# Patient Record
Sex: Female | Born: 1970 | Race: White | Hispanic: No | Marital: Married | Smoking: Never smoker
Health system: Southern US, Community
[De-identification: ages and names within clinical notes are randomized; demographics above are authoritative.]

## PROBLEM LIST (undated history)

## (undated) DIAGNOSIS — N200 Calculus of kidney: Secondary | ICD-10-CM

## (undated) HISTORY — DX: Calculus of kidney: N20.0

## (undated) HISTORY — PX: ABDOMINAL HYSTERECTOMY: SHX81

---

## 2010-11-24 ENCOUNTER — Ambulatory Visit (HOSPITAL_COMMUNITY)
Admission: RE | Admit: 2010-11-24 | Discharge: 2010-11-24 | Payer: Self-pay | Source: Home / Self Care | Attending: Urology | Admitting: Urology

## 2010-11-28 LAB — APTT: aPTT: 30 seconds (ref 24–37)

## 2010-11-28 LAB — CBC
HCT: 42.4 % (ref 36.0–46.0)
Hemoglobin: 14.1 g/dL (ref 12.0–15.0)
MCH: 29.3 pg (ref 26.0–34.0)
MCHC: 33.3 g/dL (ref 30.0–36.0)
MCV: 88 fL (ref 78.0–100.0)
Platelets: 228 10*3/uL (ref 150–400)
RBC: 4.82 MIL/uL (ref 3.87–5.11)
RDW: 12.7 % (ref 11.5–15.5)
WBC: 9.5 10*3/uL (ref 4.0–10.5)

## 2010-11-28 LAB — PROTIME-INR
INR: 1 (ref 0.00–1.49)
Prothrombin Time: 13.4 seconds (ref 11.6–15.2)

## 2012-08-04 IMAGING — CR DG ABDOMEN 1V
1 series · 1 of 1 positions shown · non-contrast
Comparison: None.

CLINICAL DATA: Right renal calculus.  Preop for lithotripsy.

ABDOMEN - 1 VIEW

[t abdomen supine]
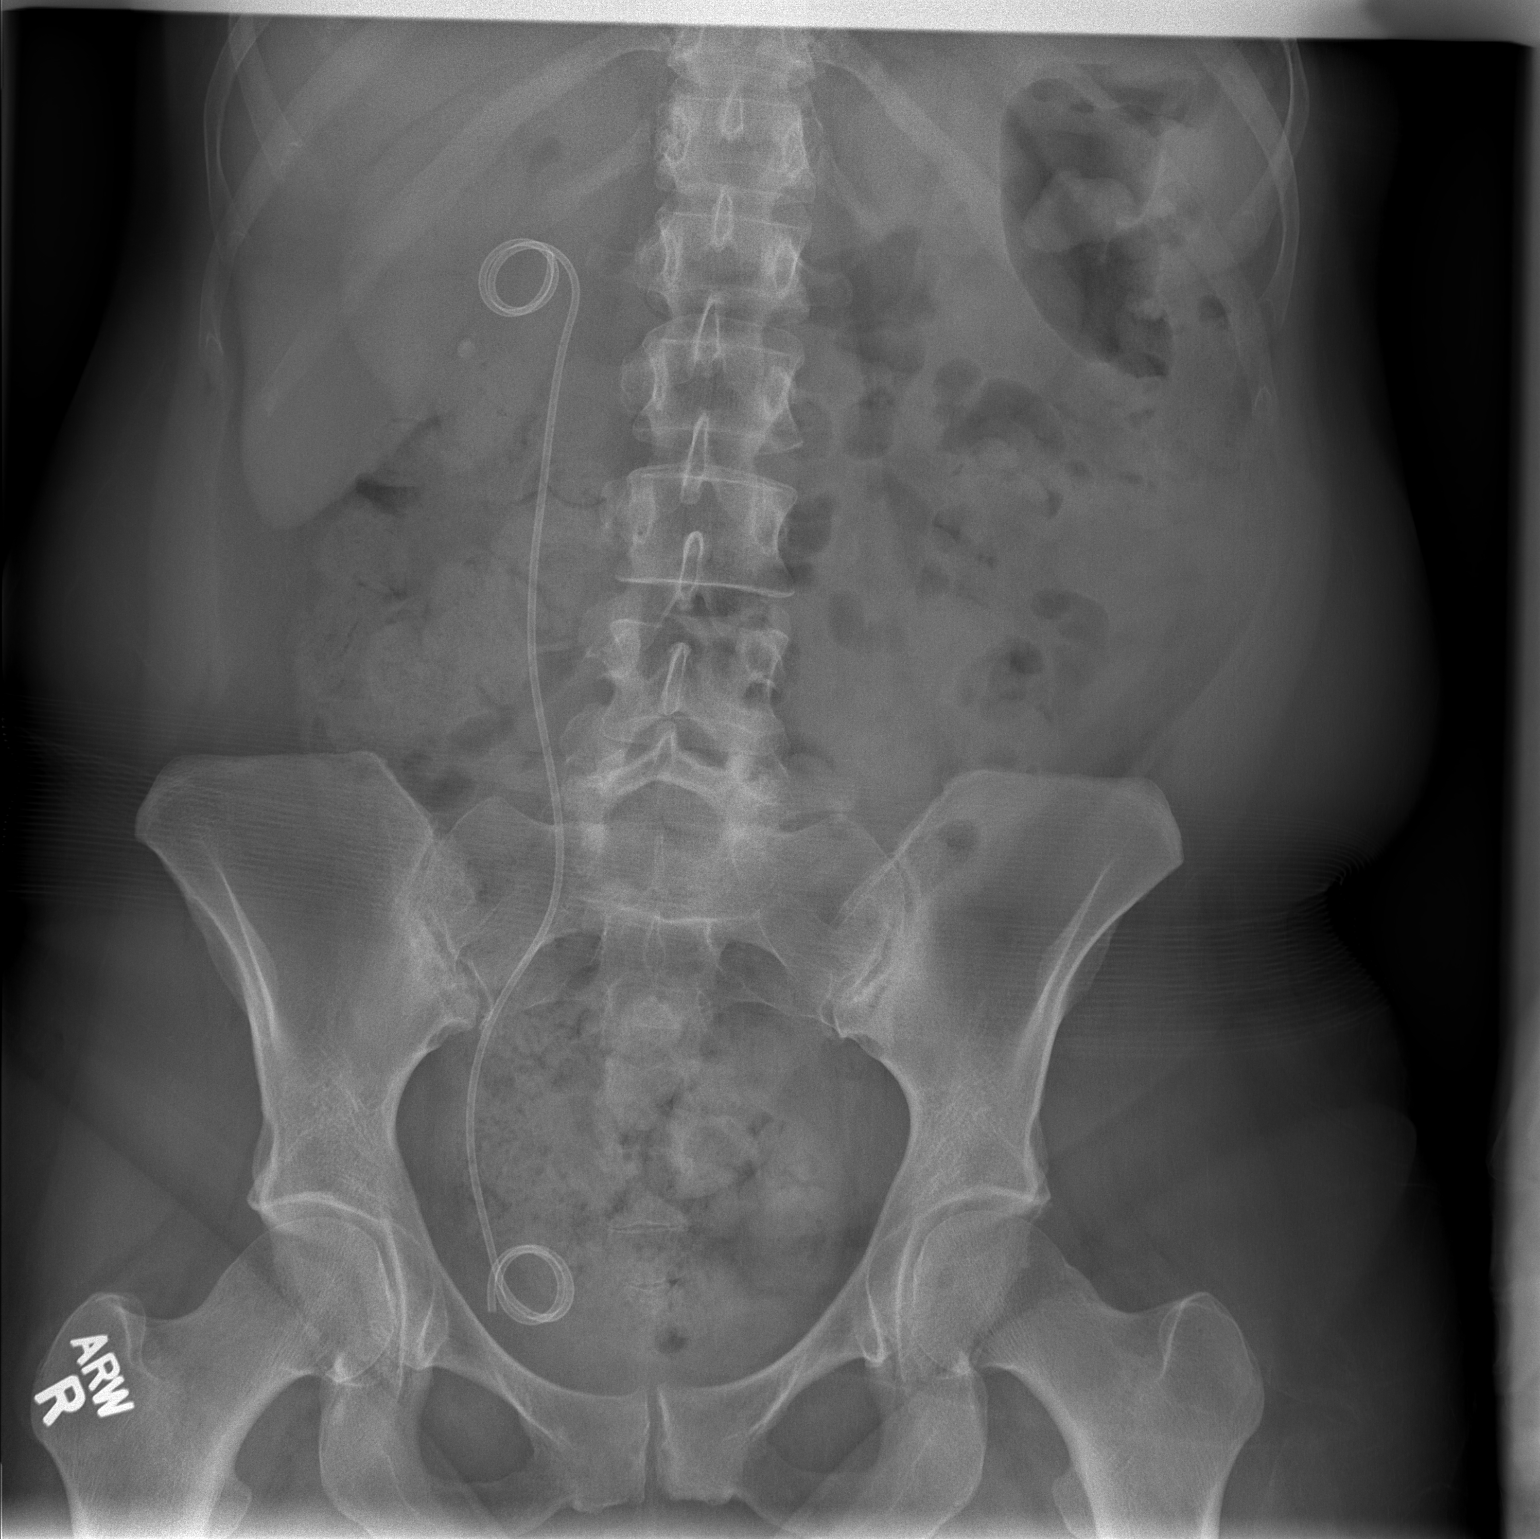

[1 of 1 positions shown; findings below may reference images not displayed]

FINDINGS: Right ureteral stent is seen in appropriate position.  A
calculus is seen in the lower pole of the right kidney which
measures 5 mm.  No other radiopaque urinary tract calculi are
identified.  The bowel gas pattern is normal.
IMPRESSION: 5 mm right lower pole renal calculus.  Right ureteral stent in
appropriate position.

## 2017-01-15 DIAGNOSIS — R202 Paresthesia of skin: Secondary | ICD-10-CM | POA: Diagnosis not present

## 2017-01-15 DIAGNOSIS — Z1239 Encounter for other screening for malignant neoplasm of breast: Secondary | ICD-10-CM | POA: Diagnosis not present

## 2017-01-15 DIAGNOSIS — Z0001 Encounter for general adult medical examination with abnormal findings: Secondary | ICD-10-CM | POA: Diagnosis not present

## 2017-01-15 DIAGNOSIS — Z0000 Encounter for general adult medical examination without abnormal findings: Secondary | ICD-10-CM | POA: Diagnosis not present

## 2017-01-15 DIAGNOSIS — R200 Anesthesia of skin: Secondary | ICD-10-CM | POA: Diagnosis not present

## 2017-01-18 DIAGNOSIS — Z1231 Encounter for screening mammogram for malignant neoplasm of breast: Secondary | ICD-10-CM | POA: Diagnosis not present

## 2017-11-22 DIAGNOSIS — G542 Cervical root disorders, not elsewhere classified: Secondary | ICD-10-CM | POA: Diagnosis not present

## 2017-11-22 DIAGNOSIS — M542 Cervicalgia: Secondary | ICD-10-CM | POA: Diagnosis not present

## 2017-11-26 DIAGNOSIS — J020 Streptococcal pharyngitis: Secondary | ICD-10-CM | POA: Diagnosis not present

## 2017-12-03 DIAGNOSIS — M50322 Other cervical disc degeneration at C5-C6 level: Secondary | ICD-10-CM | POA: Diagnosis not present

## 2017-12-03 DIAGNOSIS — M50323 Other cervical disc degeneration at C6-C7 level: Secondary | ICD-10-CM | POA: Diagnosis not present

## 2017-12-03 DIAGNOSIS — M4802 Spinal stenosis, cervical region: Secondary | ICD-10-CM | POA: Diagnosis not present

## 2017-12-06 DIAGNOSIS — M5412 Radiculopathy, cervical region: Secondary | ICD-10-CM | POA: Diagnosis not present

## 2017-12-06 DIAGNOSIS — M6281 Muscle weakness (generalized): Secondary | ICD-10-CM | POA: Diagnosis not present

## 2017-12-11 DIAGNOSIS — M5412 Radiculopathy, cervical region: Secondary | ICD-10-CM | POA: Diagnosis not present

## 2017-12-11 DIAGNOSIS — M6281 Muscle weakness (generalized): Secondary | ICD-10-CM | POA: Diagnosis not present

## 2017-12-12 DIAGNOSIS — N3000 Acute cystitis without hematuria: Secondary | ICD-10-CM | POA: Diagnosis not present

## 2017-12-13 DIAGNOSIS — M5412 Radiculopathy, cervical region: Secondary | ICD-10-CM | POA: Diagnosis not present

## 2017-12-13 DIAGNOSIS — M6281 Muscle weakness (generalized): Secondary | ICD-10-CM | POA: Diagnosis not present

## 2017-12-18 DIAGNOSIS — M5412 Radiculopathy, cervical region: Secondary | ICD-10-CM | POA: Diagnosis not present

## 2017-12-18 DIAGNOSIS — M6281 Muscle weakness (generalized): Secondary | ICD-10-CM | POA: Diagnosis not present

## 2017-12-20 DIAGNOSIS — J060 Acute laryngopharyngitis: Secondary | ICD-10-CM | POA: Diagnosis not present

## 2017-12-24 DIAGNOSIS — M542 Cervicalgia: Secondary | ICD-10-CM | POA: Diagnosis not present

## 2018-01-03 DIAGNOSIS — M5412 Radiculopathy, cervical region: Secondary | ICD-10-CM | POA: Diagnosis not present

## 2018-01-03 DIAGNOSIS — M6281 Muscle weakness (generalized): Secondary | ICD-10-CM | POA: Diagnosis not present

## 2018-01-07 DIAGNOSIS — M5412 Radiculopathy, cervical region: Secondary | ICD-10-CM | POA: Diagnosis not present

## 2018-01-07 DIAGNOSIS — M6281 Muscle weakness (generalized): Secondary | ICD-10-CM | POA: Diagnosis not present

## 2018-01-15 DIAGNOSIS — M6281 Muscle weakness (generalized): Secondary | ICD-10-CM | POA: Diagnosis not present

## 2018-01-15 DIAGNOSIS — M5412 Radiculopathy, cervical region: Secondary | ICD-10-CM | POA: Diagnosis not present

## 2018-01-22 DIAGNOSIS — M5412 Radiculopathy, cervical region: Secondary | ICD-10-CM | POA: Diagnosis not present

## 2018-01-22 DIAGNOSIS — M6281 Muscle weakness (generalized): Secondary | ICD-10-CM | POA: Diagnosis not present

## 2018-02-11 DIAGNOSIS — M6281 Muscle weakness (generalized): Secondary | ICD-10-CM | POA: Diagnosis not present

## 2018-02-11 DIAGNOSIS — M5412 Radiculopathy, cervical region: Secondary | ICD-10-CM | POA: Diagnosis not present

## 2020-09-09 ENCOUNTER — Telehealth (INDEPENDENT_AMBULATORY_CARE_PROVIDER_SITE_OTHER): Payer: Commercial Managed Care - PPO | Admitting: Family Medicine

## 2020-09-09 ENCOUNTER — Other Ambulatory Visit: Payer: Self-pay | Admitting: Family Medicine

## 2020-09-09 DIAGNOSIS — J00 Acute nasopharyngitis [common cold]: Secondary | ICD-10-CM | POA: Diagnosis not present

## 2020-09-09 MED ORDER — GUAIFENESIN-CODEINE 100-10 MG/5ML PO SOLN: 5.0000 mL | Freq: Three times a day (TID) | ORAL | 0 refills | Status: DC | PRN

## 2020-09-09 NOTE — Progress Notes (Signed)
Virtual Visit via Telephone Note   This visit type was conducted due to national recommendations for restrictions regarding the COVID-19 Pandemic (e.g. social distancing) in an effort to limit this patient's exposure and mitigate transmission in our community.  Due to her co-morbid illnesses, this patient is at least at moderate risk for complications without adequate follow up.  This format is felt to be most appropriate for this patient at this time.  The patient did not have access to video technology/had technical difficulties with video requiring transitioning to audio format only (telephone).  All issues noted in this document were discussed and addressed.  No physical exam could be performed with this format.  Patient verbally consented to a telehealth visit.   Date:  09/11/2020   ID:  Jane [facility], DOB Jul 18, 1971, MRN [id number]  Patient Location: Home Provider Location: Office/Clinic  PCP:  Blane Ohara, MD   Evaluation Performed: acute  Chief Complaint:  cough  History of Present Illness:    Jane Duncan is a 49 y.o. female c/o cough. Unable to sleep due to cough. The cough has been going on for a few days. No fever, chills, earaches, sore throat, and nasal congestion. Pt has had covid vaccinations. She has tried allergy medicines. She insists she does not have covid. This is a cough she gets every 1-2 yrs and the only thing that helps is codeine cough syrup.     The patient does not have symptoms concerning for COVID-19 infection (fever, chills, cough, or new shortness of breath).    Past Medical History:  Diagnosis Date  . Kidney stone     Past Surgical History:  Procedure Laterality Date  . ABDOMINAL HYSTERECTOMY     Unsure if abdominal or vaginal or if total or partial.   . CESAREAN SECTION     X #    Family History  Problem Relation Age of Onset  . CAD Other   . Hypertension Other     Social History   Socioeconomic History  . Marital status:  Married    Spouse name: Not on file  . Number of children: Not on file  . Years of education: Not on file  . Highest education level: Not on file  Occupational History  . Not on file  Tobacco Use  . Smoking status: Not on file  Substance and Sexual Activity  . Alcohol use: Not on file  . Drug use: Not on file  . Sexual activity: Not on file  Other Topics Concern  . Not on file  Social History Narrative  . Not on file   Social Determinants of Health   Financial Resource Strain:   . Difficulty of Paying Living Expenses: Not on file  Food Insecurity:   . Worried About Programme researcher, broadcasting/film/video in the Last Year: Not on file  . Ran Out of Food in the Last Year: Not on file  Transportation Needs:   . Lack of Transportation (Medical): Not on file  . Lack of Transportation (Non-Medical): Not on file  Physical Activity:   . Days of Exercise per Week: Not on file  . Minutes of Exercise per Session: Not on file  Stress:   . Feeling of Stress : Not on file  Social Connections:   . Frequency of Communication with Friends and Family: Not on file  . Frequency of Social Gatherings with Friends and Family: Not on file  . Attends Religious Services: Not on file  . Active Member of  Clubs or Organizations: Not on file  . Attends Banker Meetings: Not on file  . Marital Status: Not on file  Intimate Partner Violence:   . Fear of Current or Ex-Partner: Not on file  . Emotionally Abused: Not on file  . Physically Abused: Not on file  . Sexually Abused: Not on file    Outpatient Medications Prior to Visit  Medication Sig Dispense Refill  . erythromycin ophthalmic ointment SMARTSIG:In Eye(s)     No facility-administered medications prior to visit.    Allergies:   Ciprofloxacin and Erythromycin base   Social History   Tobacco Use  . Smoking status: Not on file  Substance Use Topics  . Alcohol use: Not on file  . Drug use: Not on file     Review of Systems    Constitutional: Negative for chills, fever and malaise/fatigue.  HENT: Negative for ear pain, sinus pain and sore throat.   Respiratory: Positive for cough. Negative for shortness of breath.   Cardiovascular: Negative for chest pain.  Musculoskeletal: Negative for myalgias.  Neurological: Negative for headaches.     Labs/Other Tests and Data Reviewed:    Recent Labs: No results found for requested labs within last 8760 hours.   Recent Lipid Panel No results found for: CHOL, TRIG, HDL, CHOLHDL, LDLCALC, LDLDIRECT  Wt Readings from Last 3 Encounters:  No data found for Wt     Objective:    Vital Signs:  There were no vitals taken for this visit.   Physical Exam  Does not sound short of breath on phone.   ASSESSMENT & PLAN:   1. Acute nasopharyngitis  Guaifenesin with codeine cough syrup. If worsens, pt should call and we will test her for covid 19.   COVID-19 Education: The signs and symptoms of COVID-19 were discussed with the patient and how to seek care for testing (follow up with PCP or arrange E-visit). The importance of social distancing was discussed today.  Time:   Today, I have spent 10 minutes with the patient with telehealth technology discussing the above problems.    Follow Up:  In Person prn  Signed, Blane Ohara, MD  09/11/2020 8:41 PM    [facility] [place]

## 2020-09-11 ENCOUNTER — Encounter: Payer: Self-pay | Admitting: Family Medicine

## 2021-07-25 ENCOUNTER — Other Ambulatory Visit: Payer: Self-pay

## 2021-07-25 DIAGNOSIS — Z0000 Encounter for general adult medical examination without abnormal findings: Secondary | ICD-10-CM

## 2021-08-04 ENCOUNTER — Other Ambulatory Visit (INDEPENDENT_AMBULATORY_CARE_PROVIDER_SITE_OTHER): Payer: Managed Care, Other (non HMO)

## 2021-08-04 DIAGNOSIS — Z23 Encounter for immunization: Secondary | ICD-10-CM | POA: Diagnosis not present

## 2021-08-04 DIAGNOSIS — Z0000 Encounter for general adult medical examination without abnormal findings: Secondary | ICD-10-CM

## 2021-08-04 NOTE — Progress Notes (Signed)
      Patient received her first Shingrix Vaccine today.  She tolerated it well.  Second dose is due in 2-6 months.  Creola Corn, LPN 19/62/22 9:79 AM

## 2021-08-05 LAB — CBC WITH DIFFERENTIAL/PLATELET
Basophils Absolute: 0 10*3/uL (ref 0.0–0.2)
Basos: 1 %
EOS (ABSOLUTE): 0.1 10*3/uL (ref 0.0–0.4)
Eos: 3 %
Hematocrit: 42.9 % (ref 34.0–46.6)
Hemoglobin: 14.1 g/dL (ref 11.1–15.9)
Immature Grans (Abs): 0 10*3/uL (ref 0.0–0.1)
Immature Granulocytes: 0 %
Lymphocytes Absolute: 2 10*3/uL (ref 0.7–3.1)
Lymphs: 38 %
MCH: 29.7 pg (ref 26.6–33.0)
MCHC: 32.9 g/dL (ref 31.5–35.7)
MCV: 90 fL (ref 79–97)
Monocytes Absolute: 0.6 10*3/uL (ref 0.1–0.9)
Monocytes: 11 %
Neutrophils Absolute: 2.4 10*3/uL (ref 1.4–7.0)
Neutrophils: 47 %
Platelets: 283 10*3/uL (ref 150–450)
RBC: 4.75 x10E6/uL (ref 3.77–5.28)
RDW: 12.2 % (ref 11.7–15.4)
WBC: 5.1 10*3/uL (ref 3.4–10.8)

## 2021-08-05 LAB — COMPREHENSIVE METABOLIC PANEL
ALT: 11 IU/L (ref 0–32)
AST: 15 IU/L (ref 0–40)
Albumin/Globulin Ratio: 2.1 (ref 1.2–2.2)
Albumin: 4.4 g/dL (ref 3.8–4.8)
Alkaline Phosphatase: 60 IU/L (ref 44–121)
BUN/Creatinine Ratio: 16 (ref 9–23)
BUN: 11 mg/dL (ref 6–24)
Bilirubin Total: 0.4 mg/dL (ref 0.0–1.2)
CO2: 20 mmol/L (ref 20–29)
Calcium: 9.3 mg/dL (ref 8.7–10.2)
Chloride: 105 mmol/L (ref 96–106)
Creatinine, Ser: 0.67 mg/dL (ref 0.57–1.00)
Globulin, Total: 2.1 g/dL (ref 1.5–4.5)
Glucose: 95 mg/dL (ref 65–99)
Potassium: 5 mmol/L (ref 3.5–5.2)
Sodium: 137 mmol/L (ref 134–144)
Total Protein: 6.5 g/dL (ref 6.0–8.5)
eGFR: 106 mL/min/{1.73_m2} (ref >59)

## 2021-08-05 LAB — LIPID PANEL
Chol/HDL Ratio: 2.1 ratio (ref 0.0–4.4)
Cholesterol, Total: 214 mg/dL — ABNORMAL HIGH (ref 100–199)
HDL: 104 mg/dL (ref >39)
LDL Chol Calc (NIH): 101 mg/dL — ABNORMAL HIGH (ref 0–99)
Triglycerides: 49 mg/dL (ref 0–149)
VLDL Cholesterol Cal: 9 mg/dL (ref 5–40)

## 2021-08-05 LAB — CARDIOVASCULAR RISK ASSESSMENT

## 2021-08-05 LAB — TSH: TSH: 1.47 u[IU]/mL (ref 0.450–4.500)

## 2021-08-11 ENCOUNTER — Encounter: Payer: Self-pay | Admitting: Family Medicine

## 2021-08-15 ENCOUNTER — Telehealth (INDEPENDENT_AMBULATORY_CARE_PROVIDER_SITE_OTHER): Payer: Managed Care, Other (non HMO) | Admitting: Physician Assistant

## 2021-08-15 ENCOUNTER — Encounter: Payer: Self-pay | Admitting: Physician Assistant

## 2021-08-15 VITALS — Ht 63.0 in | Wt 145.0 lb

## 2021-08-15 DIAGNOSIS — U071 COVID-19: Secondary | ICD-10-CM | POA: Diagnosis not present

## 2021-08-15 DIAGNOSIS — J020 Streptococcal pharyngitis: Secondary | ICD-10-CM | POA: Insufficient documentation

## 2021-08-15 HISTORY — DX: COVID-19: U07.1

## 2021-08-15 LAB — POC COVID19 BINAXNOW: SARS Coronavirus 2 Ag: POSITIVE — AB

## 2021-08-15 LAB — POCT RAPID STREP A (OFFICE): Rapid Strep A Screen: POSITIVE — AB

## 2021-08-15 MED ORDER — GUAIFENESIN-CODEINE 100-10 MG/5ML PO SYRP: 5.0000 mL | ORAL_SOLUTION | Freq: Three times a day (TID) | ORAL | 0 refills | Status: DC | PRN

## 2021-08-15 MED ORDER — AMOXICILLIN 875 MG PO TABS: 875.0000 mg | ORAL_TABLET | Freq: Two times a day (BID) | ORAL | 0 refills | Status: AC

## 2021-08-15 NOTE — Progress Notes (Signed)
Virtual Visit via Telephone Note   This visit type was conducted due to national recommendations for restrictions regarding the COVID-19 Pandemic (e.g. social distancing) in an effort to limit this patient's exposure and mitigate transmission in our community.  Due to her co-morbid illnesses, this patient is at least at moderate risk for complications without adequate follow up.  This format is felt to be most appropriate for this patient at this time.  The patient did not have access to video technology/had technical difficulties with video requiring transitioning to audio format only (telephone).  All issues noted in this document were discussed and addressed.  No physical exam could be performed with this format.  Patient verbally consented to a telehealth visit.   Date:  08/15/2021   ID:  Jane Duncan, DOB 1971/07/27, MRN [id number]  Patient Location: Home Provider Location: Office  PCP:  Blane Ohara, MD     Chief Complaint:  covid exposure/sore throat  History of Present Illness:    Jane Duncan is a 49 y.o. female with complaints of cough, sore throat and uri symptoms since last week - worse in past few days - husband with same symptoms The patient does have symptoms concerning for COVID-19 infection (fever, chills, cough, or new shortness of breath).    Past Medical History:  Diagnosis Date   Kidney stone    Past Surgical History:  Procedure Laterality Date   ABDOMINAL HYSTERECTOMY     Unsure if abdominal or vaginal or if total or partial.    CESAREAN SECTION     X #     Current Meds  Medication Sig   amoxicillin (AMOXIL) 875 MG tablet Take 1 tablet (875 mg total) by mouth 2 (two) times daily for 10 days.   guaiFENesin-codeine (ROBITUSSIN AC) 100-10 MG/5ML syrup Take 5 mLs by mouth 3 (three) times daily as needed for cough.     Allergies:   Ciprofloxacin and Erythromycin base   Social History   Tobacco Use   Smoking status: Never   Smokeless tobacco:  Never  Substance Use Topics   Alcohol use: Yes    Alcohol/week: 14.0 standard drinks    Types: 14 Glasses of wine per week    Comment: 1-2 glasses of wine per night.   Drug use: Never     Family Hx: The patient's family history includes CAD in her father and another family member; Hypertension in her brother and another family member.  ROS:   Please see the history of present illness.    All other systems reviewed and are negative.  Labs/Other Tests and Data Reviewed:    Recent Labs: 08/04/2021: ALT 11; BUN 11; Creatinine, Ser 0.67; Hemoglobin 14.1; Platelets 283; Potassium 5.0; Sodium 137; TSH 1.470   Recent Lipid Panel Lab Results  Component Value Date/Time   CHOL 214 (H) 08/04/2021 09:05 AM   TRIG 49 08/04/2021 09:05 AM   HDL 104 08/04/2021 09:05 AM   CHOLHDL 2.1 08/04/2021 09:05 AM   LDLCALC 101 (H) 08/04/2021 09:05 AM    Wt Readings from Last 3 Encounters:  08/15/21 145 lb (65.8 kg)     Objective:    Vital Signs:  Ht 5\' 3"  (1.6 m)   Wt 145 lb (65.8 kg)   BMI 25.69 kg/m    VITAL SIGNS:  reviewed GEN:  no acute distress Video Visit on 08/15/2021  Component Date Value Ref Range Status   SARS Coronavirus 2 Ag 08/15/2021 Positive (A) Negative Final   Rapid Strep A  Screen 08/15/2021 Positive (A) Negative Final     ASSESSMENT & PLAN:    Strep pharyngitis - rx for amoxil as directed COVID 19 - rx for cheratussin , rest, fluids - quarantine according to [employer] guidelines  COVID-19 Education: The signs and symptoms of COVID-19 were discussed with the patient and how to seek care for testing (follow up with PCP or arrange E-visit). The importance of social distancing was discussed today.  Time:   Today, I have spent 10 minutes with the patient with telehealth technology discussing the above problems.     Medication Adjustments/Labs and Tests Ordered: Current medicines are reviewed at length with the patient today.  Concerns regarding medicines are outlined above.    Tests Ordered: Orders Placed This Encounter  Procedures   POC COVID-19   POCT rapid strep A    Medication Changes: Meds ordered this encounter  Medications   amoxicillin (AMOXIL) 875 MG tablet    Sig: Take 1 tablet (875 mg total) by mouth 2 (two) times daily for 10 days.    Dispense:  20 tablet    Refill:  0    Order Specific Question:   Supervising Provider    Answer:   COX, Aniceto Boss   guaiFENesin-codeine (ROBITUSSIN AC) 100-10 MG/5ML syrup    Sig: Take 5 mLs by mouth 3 (three) times daily as needed for cough.    Dispense:  120 mL    Refill:  0    Order Specific Question:   Supervising Provider    AnswerCorey Harold    Follow Up:  In Person prn  Signed, Vickey Sages, PA-C  08/15/2021 2:42 PM    [facility]

## 2021-08-16 ENCOUNTER — Other Ambulatory Visit: Payer: Self-pay | Admitting: Family Medicine

## 2021-08-16 DIAGNOSIS — U071 COVID-19: Secondary | ICD-10-CM

## 2021-08-16 MED ORDER — GUAIFENESIN-CODEINE 100-10 MG/5ML PO SYRP: 5.0000 mL | ORAL_SOLUTION | Freq: Three times a day (TID) | ORAL | 0 refills | Status: DC | PRN

## 2021-08-26 ENCOUNTER — Ambulatory Visit (INDEPENDENT_AMBULATORY_CARE_PROVIDER_SITE_OTHER): Payer: Managed Care, Other (non HMO) | Admitting: Family Medicine

## 2021-08-26 ENCOUNTER — Other Ambulatory Visit: Payer: Self-pay

## 2021-08-26 ENCOUNTER — Encounter: Payer: Self-pay | Admitting: Family Medicine

## 2021-08-26 VITALS — BP 120/86 | HR 90 | Temp 96.2°F | Resp 15 | Ht 63.0 in | Wt 147.0 lb

## 2021-08-26 DIAGNOSIS — Z1231 Encounter for screening mammogram for malignant neoplasm of breast: Secondary | ICD-10-CM

## 2021-08-26 DIAGNOSIS — F5101 Primary insomnia: Secondary | ICD-10-CM

## 2021-08-26 DIAGNOSIS — Z23 Encounter for immunization: Secondary | ICD-10-CM

## 2021-08-26 DIAGNOSIS — Z0000 Encounter for general adult medical examination without abnormal findings: Secondary | ICD-10-CM

## 2021-08-26 MED ORDER — TRAZODONE HCL 50 MG PO TABS: 25.0000 mg | ORAL_TABLET | Freq: Every evening | ORAL | 0 refills | Status: DC | PRN

## 2021-08-26 NOTE — Patient Instructions (Signed)
Start on trazodone 50 mg once daily at night.

## 2021-08-26 NOTE — Progress Notes (Signed)
Subjective:  Patient ID: Jane [facility], female    DOB: 1970-11-25  Age: 50 y.o. MRN: [id number]  Chief Complaint  Patient presents with   Annual Exam    HPI   Well Adult Physical: Patient here for a comprehensive physical exam.The patient reports no problems Do you take any herbs or supplements that were not prescribed by a doctor? no Are you taking calcium supplements? no Are you taking aspirin daily? no  Encounter for general adult medical examination without abnormal findings  Physical ("At Risk" items are starred): Patient's last physical exam was 1 year ago .  Patient is not afflicted from Stress Incontinence and Urge Incontinence  Patient wears a seat belt, has smoke detectors, has carbon monoxide detectors, practices appropriate gun safety, and wears sunscreen with extended sun exposure. Dental Care: biannual cleanings, brushes and flosses daily. 06/2021 Ophthalmology/Optometry: Annual visit. Every February. Last one was on 12/2020. Hearing loss: none Vision impairments: none  Menarche: 50 years old Menstrual History: Hysterectomy LMP: Unknown Pregnancy history: 4 pregnancies Safe at home: Yes Self breast exams: Yes  Social Hx   Social History   Socioeconomic History   Marital status: Married    Spouse name: Not on file   Number of children: Not on file   Years of education: Not on file   Highest education level: Not on file  Occupational History   Occupation: home maker  Tobacco Use   Smoking status: Never   Smokeless tobacco: Never  Substance and Sexual Activity   Alcohol use: Yes    Alcohol/week: 14.0 standard drinks    Types: 14 Glasses of wine per week    Comment: 1-2 glasses of wine per night.   Drug use: Never   Sexual activity: Yes  Other Topics Concern   Not on file  Social History Narrative   Not on file   Social Determinants of Health   Financial Resource Strain: Not on file  Food Insecurity: Not on file  Transportation Needs: Not on  file  Physical Activity: Not on file  Stress: Not on file  Social Connections: Not on file   Past Medical History:  Diagnosis Date   COVID-19 08/15/2021   Kidney stone    Past Surgical History:  Procedure Laterality Date   ABDOMINAL HYSTERECTOMY     Unsure if abdominal or vaginal or if total or partial.    CESAREAN SECTION     X #    Family History  Problem Relation Age of Onset   CAD Other    Hypertension Other    CAD Father    Hypertension Brother     Review of Systems  Constitutional:  Negative for chills, fatigue and fever.  HENT:  Negative for congestion, ear pain and sore throat.   Respiratory:  Negative for cough and shortness of breath.   Cardiovascular:  Negative for chest pain and palpitations.  Gastrointestinal:  Negative for abdominal pain, constipation, diarrhea, nausea and vomiting.  Endocrine: Negative for polydipsia, polyphagia and polyuria.  Genitourinary:  Negative for difficulty urinating and dysuria.  Musculoskeletal:  Negative for arthralgias, back pain and myalgias.  Skin:  Negative for rash.  Neurological:  Negative for headaches.  Psychiatric/Behavioral:  Positive for sleep disturbance (has been waking up in the middle of the night. Initiation of sleep is fine. Has tried multiple behavioral techniques without success. this has only been an issue for the last few weeks.). Negative for dysphoric mood. The patient is not nervous/anxious.  Objective:  BP 120/86   Pulse 90   Temp (!) 96.2 F (35.7 C)   Resp 15   Ht 5\' 3"  (1.6 m)   Wt 147 lb (66.7 kg)   LMP  (LMP Unknown)   SpO2 98%   BMI 26.04 kg/m   BP/Weight 08/26/2021 08/15/2021  Systolic BP 120 -  Diastolic BP 86 -  Wt. (Lbs) 147 145  BMI 26.04 25.69    Physical Exam Vitals reviewed.  Constitutional:      General: She is not in acute distress.    Appearance: Normal appearance. She is normal weight.  HENT:     Right Ear: Tympanic membrane and ear canal normal.     Left Ear:  Tympanic membrane and ear canal normal.     Nose: Nose normal. No congestion or rhinorrhea.     Mouth/Throat:     Pharynx: No oropharyngeal exudate or posterior oropharyngeal erythema.  Eyes:     Conjunctiva/sclera: Conjunctivae normal.  Neck:     Thyroid: No thyroid mass.     Vascular: No carotid bruit.  Cardiovascular:     Rate and Rhythm: Normal rate and regular rhythm.     Pulses: Normal pulses.     Heart sounds: Normal heart sounds. No murmur heard. Pulmonary:     Effort: Pulmonary effort is normal.     Breath sounds: Normal breath sounds.  Abdominal:     General: Bowel sounds are normal.     Palpations: Abdomen is soft. There is no mass.     Tenderness: There is no abdominal tenderness.  Musculoskeletal:        General: Normal range of motion.  Lymphadenopathy:     Cervical: No cervical adenopathy.  Skin:    General: Skin is warm and dry.  Neurological:     Mental Status: She is alert and oriented to person, place, and time.     Cranial Nerves: No cranial nerve deficit.  Psychiatric:        Mood and Affect: Mood normal.        Behavior: Behavior normal.    Lab Results  Component Value Date   WBC 5.1 08/04/2021   HGB 14.1 08/04/2021   HCT 42.9 08/04/2021   PLT 283 08/04/2021   GLUCOSE 95 08/04/2021   CHOL 214 (H) 08/04/2021   TRIG 49 08/04/2021   HDL 104 08/04/2021   LDLCALC 101 (H) 08/04/2021   ALT 11 08/04/2021   AST 15 08/04/2021   NA 137 08/04/2021   K 5.0 08/04/2021   CL 105 08/04/2021   CREATININE 0.67 08/04/2021   BUN 11 08/04/2021   CO2 20 08/04/2021   TSH 1.470 08/04/2021   INR 1.00 11/24/2010      Assessment & Plan:   Problem List Items Addressed This Visit       Other   Primary insomnia    Start on trazodone 50 mg once at night prn insomnia. Recommend take it regularly for the next 1-2 weeks to reset her sleep cycle.      Relevant Medications   traZODone (DESYREL) 50 MG tablet   Visit for screening mammogram   Relevant Orders    MM Digital Screening   Routine medical exam - Primary    Recommend continue to work on eating healthy diet and exercise. Work on 01/23/2011.      Other Visit Diagnoses     Encounter for immunization       Relevant Orders   Flu Vaccine MDCK  QUAD PF (Completed)         Body mass index is 26.04 kg/m.    This is a list of the screening recommended for you and due dates:  Health Maintenance  Topic Date Due   Tetanus Vaccine  Never done   Colon Cancer Screening  Never done   Mammogram  Never done   COVID-19 Vaccine (3 - Booster for Pfizer series) 09/11/2021*   Hepatitis C Screening: USPSTF Recommendation to screen - Ages 18-79 yo.  09/04/2022*   HIV Screening  09/04/2022*   Zoster (Shingles) Vaccine (2 of 2) 09/29/2021   Flu Shot  Completed   Pneumococcal Vaccination  Aged Out   HPV Vaccine  Aged Out   Pap Smear  Discontinued  *Topic was postponed. The date shown is not the original due date.     Meds ordered this encounter  Medications   traZODone (DESYREL) 50 MG tablet    Sig: Take 0.5-1 tablets (25-50 mg total) by mouth at bedtime as needed for sleep.    Dispense:  30 tablet    Refill:  0     Follow-up: No follow-ups on file.  An After Visit Summary was printed and given to the patient.  Blane Ohara, MD [facility] [phone]

## 2021-08-28 DIAGNOSIS — F5101 Primary insomnia: Secondary | ICD-10-CM | POA: Insufficient documentation

## 2021-08-28 DIAGNOSIS — Z0000 Encounter for general adult medical examination without abnormal findings: Secondary | ICD-10-CM | POA: Insufficient documentation

## 2021-09-01 ENCOUNTER — Telehealth: Payer: Self-pay | Admitting: Family Medicine

## 2021-09-01 NOTE — Telephone Encounter (Signed)
   Jane Duncan has been scheduled for the following appointment:  WHAT: SCREENING MAMMOGRAM WHERE: [facility] DATE: 09/15/21 TIME: 4:15 PM ARRIVAL TIME  Patient has been made aware.

## 2021-09-04 DIAGNOSIS — Z1231 Encounter for screening mammogram for malignant neoplasm of breast: Secondary | ICD-10-CM | POA: Insufficient documentation

## 2021-09-04 NOTE — Assessment & Plan Note (Signed)
Start on trazodone 50 mg once at night prn insomnia. Recommend take it regularly for the next 1-2 weeks to reset her sleep cycle.

## 2021-09-04 NOTE — Assessment & Plan Note (Signed)
Recommend continue to work on eating healthy diet and exercise. Work on [employer].

## 2021-10-17 ENCOUNTER — Ambulatory Visit: Payer: Managed Care, Other (non HMO) | Admitting: Family Medicine

## 2021-10-17 ENCOUNTER — Encounter: Payer: Self-pay | Admitting: Family Medicine

## 2021-10-17 VITALS — BP 132/78 | HR 102 | Temp 97.7°F | Ht 63.0 in | Wt 147.0 lb

## 2021-10-17 DIAGNOSIS — J111 Influenza due to unidentified influenza virus with other respiratory manifestations: Secondary | ICD-10-CM | POA: Diagnosis not present

## 2021-10-17 DIAGNOSIS — R051 Acute cough: Secondary | ICD-10-CM | POA: Insufficient documentation

## 2021-10-17 LAB — POCT INFLUENZA A/B
Influenza A, POC: NEGATIVE
Influenza B, POC: NEGATIVE

## 2021-10-17 LAB — POC COVID19 BINAXNOW: SARS Coronavirus 2 Ag: NEGATIVE

## 2021-10-17 MED ORDER — HYDROCODONE BIT-HOMATROP MBR 5-1.5 MG/5ML PO SOLN: 5.0000 mL | Freq: Four times a day (QID) | ORAL | 0 refills | Status: DC | PRN

## 2021-10-17 MED ORDER — OSELTAMIVIR PHOSPHATE 75 MG PO CAPS: 75.0000 mg | ORAL_CAPSULE | Freq: Two times a day (BID) | ORAL | 0 refills | Status: DC

## 2021-10-17 NOTE — Progress Notes (Signed)
Acute Office Visit  Subjective:    Patient ID: Jane Duncan, female    DOB: 03-24-1971, 50 y.o.   MRN: [id number]  Chief Complaint  Patient presents with   Cough   Patient is in today for chills, chest tightness, cough, body aches, and headache began this morning. Cough Associated symptoms include chills, headaches, rhinorrhea and a sore throat. Pertinent negatives include no chest pain, ear pain, fever, myalgias, rash, shortness of breath or wheezing.    Past Medical History:  Diagnosis Date   COVID-19 08/15/2021   Kidney stone     Past Surgical History:  Procedure Laterality Date   ABDOMINAL HYSTERECTOMY     Unsure if abdominal or vaginal or if total or partial.    CESAREAN SECTION     X #    Family History  Problem Relation Age of Onset   CAD Other    Hypertension Other    CAD Father    Hypertension Brother     Social History   Socioeconomic History   Marital status: Married    Spouse name: Not on file   Number of children: Not on file   Years of education: Not on file   Highest education level: Not on file  Occupational History   Occupation: home maker  Tobacco Use   Smoking status: Never   Smokeless tobacco: Never  Substance and Sexual Activity   Alcohol use: Yes    Alcohol/week: 14.0 standard drinks    Types: 14 Glasses of wine per week    Comment: 1-2 glasses of wine per night.   Drug use: Never   Sexual activity: Yes  Other Topics Concern   Not on file  Social History Narrative   Not on file   Social Determinants of Health   Financial Resource Strain: Not on file  Food Insecurity: Not on file  Transportation Needs: Not on file  Physical Activity: Not on file  Stress: Not on file  Social Connections: Not on file  Intimate Partner Violence: Not on file    Outpatient Medications Prior to Visit  Medication Sig Dispense Refill   traZODone (DESYREL) 50 MG tablet Take 0.5-1 tablets (25-50 mg total) by mouth at bedtime as needed for  sleep. 30 tablet 0   No facility-administered medications prior to visit.    Allergies  Allergen Reactions   Ciprofloxacin     MALAISE   Erythromycin Base Rash    Review of Systems  Constitutional:  Positive for chills. Negative for appetite change, fatigue and fever.  HENT:  Positive for rhinorrhea and sore throat. Negative for congestion, ear pain and sinus pressure.   Eyes:  Negative for pain.  Respiratory:  Positive for cough and chest tightness. Negative for shortness of breath and wheezing.   Cardiovascular:  Negative for chest pain and palpitations.  Gastrointestinal:  Negative for abdominal pain, constipation, diarrhea, nausea and vomiting.  Genitourinary:  Negative for dysuria and hematuria.  Musculoskeletal:  Negative for arthralgias, back pain, joint swelling and myalgias.  Skin:  Negative for rash.  Neurological:  Positive for headaches. Negative for dizziness and weakness.  Psychiatric/Behavioral:  Negative for dysphoric mood. The patient is not nervous/anxious.       Objective:    Physical Exam Vitals reviewed.  Constitutional:      Appearance: She is normal weight. She is ill-appearing.  HENT:     Right Ear: Tympanic membrane, ear canal and external ear normal.     Left Ear: Tympanic membrane, ear  canal and external ear normal.     Nose: Congestion present.     Mouth/Throat:     Pharynx: Oropharynx is clear.  Cardiovascular:     Rate and Rhythm: Normal rate and regular rhythm.     Heart sounds: Normal heart sounds. No murmur heard. Pulmonary:     Effort: Pulmonary effort is normal. No respiratory distress.     Breath sounds: Normal breath sounds.  Lymphadenopathy:     Cervical: No cervical adenopathy.  Neurological:     Mental Status: She is alert and oriented to person, place, and time.  Psychiatric:        Mood and Affect: Mood normal.        Behavior: Behavior normal.    BP 132/78 (BP Location: Right Arm, Patient Position: Sitting)   Pulse (!)  102   Temp 97.7 F (36.5 C) (Temporal)   Ht _0  (1.6 m)   Wt 147 lb (66.7 kg)   LMP  (LMP Unknown)   SpO2 97%   BMI 26.04 kg/m  Wt Readings from Last 3 Encounters:  10/17/21 147 lb (66.7 kg)  08/26/21 147 lb (66.7 kg)  08/15/21 145 lb (65.8 kg)    Health Maintenance Due  Topic Date Due   TETANUS/TDAP  Never done   COLONOSCOPY (Pts 45-40yr Insurance coverage will need to be confirmed)  Never done   COVID-19 Vaccine (3 - Booster for [employer]) 08/06/2020   MAMMOGRAM  Never done   Zoster Vaccines- Shingrix (2 of 2) 09/29/2021    There are no preventive care reminders to display for this patient.   Lab Results  Component Value Date   TSH 1.470 08/04/2021   Lab Results  Component Value Date   WBC 5.1 08/04/2021   HGB 14.1 08/04/2021   HCT 42.9 08/04/2021   MCV 90 08/04/2021   PLT 283 08/04/2021   Lab Results  Component Value Date   NA 137 08/04/2021   K 5.0 08/04/2021   CO2 20 08/04/2021   GLUCOSE 95 08/04/2021   BUN 11 08/04/2021   CREATININE 0.67 08/04/2021   BILITOT 0.4 08/04/2021   ALKPHOS 60 08/04/2021   AST 15 08/04/2021   ALT 11 08/04/2021   PROT 6.5 08/04/2021   ALBUMIN 4.4 08/04/2021   CALCIUM 9.3 08/04/2021   EGFR 106 08/04/2021   Lab Results  Component Value Date   CHOL 214 (H) 08/04/2021   Lab Results  Component Value Date   HDL 104 08/04/2021   Lab Results  Component Value Date   LDLCALC 101 (H) 08/04/2021   Lab Results  Component Value Date   TRIG 49 08/04/2021   Lab Results  Component Value Date   CHOLHDL 2.1 08/04/2021   No results found for: HGBA1C       Assessment & Plan:   Problem List Items Addressed This Visit       Respiratory   Influenza    Despite negative flu test, I am concerned it is a false negative as clinically she has influenza. Rx. Tamiflu given.  Rest, Fluids, Tylenol, nsaids.        Relevant Medications   oseltamivir (TAMIFLU) 75 MG capsule   Other Relevant Orders   POCT Influenza A/B  (Completed)     Other   Acute cough - Primary    hydromet cough syrup Negative for covid      Relevant Orders   POC COVID-19 BinaxNow (Completed)    I,Lauren M Auman,acting as a scribe for  Rochel Brome, MD.,have documented all relevant documentation on the behalf of Rochel Brome, MD,as directed by  Rochel Brome, MD while in the presence of Rochel Brome, MD.   Rochel Brome, MD

## 2021-10-17 NOTE — Assessment & Plan Note (Signed)
hydromet cough syrup Negative for covid

## 2021-10-17 NOTE — Assessment & Plan Note (Signed)
Despite negative flu test, I am concerned it is a false negative as clinically she has influenza. Rx. Tamiflu given.  Rest, Fluids, Tylenol, nsaids.

## 2021-11-10 NOTE — Telephone Encounter (Signed)
PT NO SHOWED FOR MAMMOGRAM

## 2021-12-29 ENCOUNTER — Other Ambulatory Visit: Payer: Self-pay

## 2021-12-29 ENCOUNTER — Ambulatory Visit: Payer: Managed Care, Other (non HMO) | Admitting: Family Medicine

## 2021-12-29 VITALS — Ht 63.0 in | Wt 146.0 lb

## 2021-12-29 DIAGNOSIS — R300 Dysuria: Secondary | ICD-10-CM

## 2021-12-29 DIAGNOSIS — N3000 Acute cystitis without hematuria: Secondary | ICD-10-CM | POA: Diagnosis not present

## 2021-12-29 LAB — POCT URINALYSIS DIPSTICK
Bilirubin, UA: NEGATIVE
Blood, UA: NEGATIVE
Glucose, UA: NEGATIVE
Ketones, UA: NEGATIVE
Nitrite, UA: NEGATIVE
Protein, UA: NEGATIVE
Spec Grav, UA: <=1.005 — AB (ref 1.010–1.025)
Urobilinogen, UA: 0.2 E.U./dL
pH, UA: 7 (ref 5.0–8.0)

## 2021-12-29 MED ORDER — NITROFURANTOIN MONOHYD MACRO 100 MG PO CAPS: 100.0000 mg | ORAL_CAPSULE | Freq: Two times a day (BID) | ORAL | 0 refills | Status: DC

## 2021-12-29 NOTE — Progress Notes (Signed)
Acute Office Visit  Subjective:    Patient ID: Jane Duncan, female    DOB: 10-07-1971, 51 y.o.   MRN: [id number]  Chief Complaint  Patient presents with   Dysuria   HPI: Patient is in today for dysuria.  Symptoms started early this am with urinary frequency and burning.   Past Medical History:  Diagnosis Date   COVID-19 08/15/2021   Kidney stone     Past Surgical History:  Procedure Laterality Date   ABDOMINAL HYSTERECTOMY     Unsure if abdominal or vaginal or if total or partial.    CESAREAN SECTION     X #    Family History  Problem Relation Age of Onset   CAD Other    Hypertension Other    CAD Father    Hypertension Brother     Social History   Socioeconomic History   Marital status: Married    Spouse name: Not on file   Number of children: Not on file   Years of education: Not on file   Highest education level: Not on file  Occupational History   Occupation: home maker  Tobacco Use   Smoking status: Never   Smokeless tobacco: Never  Substance and Sexual Activity   Alcohol use: Yes    Alcohol/week: 14.0 standard drinks    Types: 14 Glasses of wine per week    Comment: 1-2 glasses of wine per night.   Drug use: Never   Sexual activity: Yes  Other Topics Concern   Not on file  Social History Narrative   Not on file   Social Determinants of Health   Financial Resource Strain: Not on file  Food Insecurity: Not on file  Transportation Needs: Not on file  Physical Activity: Not on file  Stress: Not on file  Social Connections: Not on file  Intimate Partner Violence: Not on file    Outpatient Medications Prior to Visit  Medication Sig Dispense Refill   traZODone (DESYREL) 50 MG tablet Take 0.5-1 tablets (25-50 mg total) by mouth at bedtime as needed for sleep. 30 tablet 0   HYDROcodone bit-homatropine (HYDROMET) 5-1.5 MG/5ML syrup Take 5 mLs by mouth every 6 (six) hours as needed for cough. 120 mL 0   oseltamivir (TAMIFLU) 75 MG capsule  Take 1 capsule (75 mg total) by mouth 2 (two) times daily. 10 capsule 0   No facility-administered medications prior to visit.    Allergies  Allergen Reactions   Ciprofloxacin     MALAISE   Erythromycin Base Rash    Review of Systems  Constitutional:  Negative for chills, fatigue and fever.  HENT:  Negative for congestion, rhinorrhea and sore throat.   Respiratory:  Negative for cough and shortness of breath.   Cardiovascular:  Negative for chest pain.  Gastrointestinal:  Negative for abdominal pain, constipation, diarrhea, nausea and vomiting.  Genitourinary:  Positive for dysuria, frequency and urgency.  Musculoskeletal:  Negative for back pain and myalgias.  Neurological:  Negative for dizziness, weakness, light-headedness and headaches.  Psychiatric/Behavioral:  Negative for dysphoric mood. The patient is not nervous/anxious.       Objective:    Physical Exam Vitals reviewed.  Constitutional:      Appearance: Normal appearance. She is normal weight.  Cardiovascular:     Rate and Rhythm: Normal rate and regular rhythm.     Heart sounds: Normal heart sounds.  Pulmonary:     Effort: Pulmonary effort is normal. No respiratory distress.  Breath sounds: Normal breath sounds.  Abdominal:     General: Abdomen is flat. Bowel sounds are normal.     Palpations: Abdomen is soft.     Tenderness: There is no abdominal tenderness.  Neurological:     Mental Status: She is alert and oriented to person, place, and time.  Psychiatric:        Mood and Affect: Mood normal.        Behavior: Behavior normal.    Ht 5' 3"  (1.6 m)    Wt 146 lb (66.2 kg)    LMP  (LMP Unknown)    BMI 25.86 kg/m  Wt Readings from Last 3 Encounters:  12/29/21 146 lb (66.2 kg)  10/17/21 147 lb (66.7 kg)  08/26/21 147 lb (66.7 kg)    Health Maintenance Due  Topic Date Due   TETANUS/TDAP  Never done   COLONOSCOPY (Pts 45-64yr Insurance coverage will need to be confirmed)  Never done   COVID-19  Vaccine (3 - Booster for [employer]) 08/06/2020   MAMMOGRAM  Never done   Zoster Vaccines- Shingrix (2 of 2) 09/29/2021    There are no preventive care reminders to display for this patient.   Lab Results  Component Value Date   TSH 1.470 08/04/2021   Lab Results  Component Value Date   WBC 5.1 08/04/2021   HGB 14.1 08/04/2021   HCT 42.9 08/04/2021   MCV 90 08/04/2021   PLT 283 08/04/2021   Lab Results  Component Value Date   NA 137 08/04/2021   K 5.0 08/04/2021   CO2 20 08/04/2021   GLUCOSE 95 08/04/2021   BUN 11 08/04/2021   CREATININE 0.67 08/04/2021   BILITOT 0.4 08/04/2021   ALKPHOS 60 08/04/2021   AST 15 08/04/2021   ALT 11 08/04/2021   PROT 6.5 08/04/2021   ALBUMIN 4.4 08/04/2021   CALCIUM 9.3 08/04/2021   EGFR 106 08/04/2021   Lab Results  Component Value Date   CHOL 214 (H) 08/04/2021   Lab Results  Component Value Date   HDL 104 08/04/2021   Lab Results  Component Value Date   LDLCALC 101 (H) 08/04/2021   Lab Results  Component Value Date   TRIG 49 08/04/2021   Lab Results  Component Value Date   CHOLHDL 2.1 08/04/2021   No results found for: HGBA1C     Assessment & Plan:   Problem List Items Addressed This Visit       Genitourinary   Acute cystitis without hematuria - Primary    Rx: macrobid and pyridium      Relevant Medications   nitrofurantoin, macrocrystal-monohydrate, (MACROBID) 100 MG capsule   Other Relevant Orders   POCT urinalysis dipstick (Completed)   Urine Culture   Meds ordered this encounter  Medications   nitrofurantoin, macrocrystal-monohydrate, (MACROBID) 100 MG capsule    Sig: Take 1 capsule (100 mg total) by mouth 2 (two) times daily.    Dispense:  14 capsule    Refill:  0    Orders Placed This Encounter  Procedures   Urine Culture   POCT urinalysis dipstick     Follow-up: Return if symptoms worsen or fail to improve.  An After Visit Summary was printed and given to the patient.  KRochel Brome MD [facility] ([phone]

## 2021-12-31 ENCOUNTER — Encounter: Payer: Self-pay | Admitting: Family Medicine

## 2021-12-31 DIAGNOSIS — N3000 Acute cystitis without hematuria: Secondary | ICD-10-CM | POA: Insufficient documentation

## 2021-12-31 NOTE — Assessment & Plan Note (Signed)
Rx: macrobid and pyridium

## 2022-02-10 ENCOUNTER — Encounter: Payer: Self-pay | Admitting: Family Medicine

## 2022-02-10 ENCOUNTER — Ambulatory Visit: Payer: Managed Care, Other (non HMO) | Admitting: Family Medicine

## 2022-02-10 VITALS — BP 110/68 | HR 78 | Temp 96.4°F | Resp 18 | Ht 63.0 in | Wt 146.2 lb

## 2022-02-10 DIAGNOSIS — K117 Disturbances of salivary secretion: Secondary | ICD-10-CM | POA: Diagnosis not present

## 2022-02-10 MED ORDER — MONTELUKAST SODIUM 10 MG PO TABS: 10.0000 mg | ORAL_TABLET | Freq: Every day | ORAL | 3 refills | Status: DC

## 2022-02-10 NOTE — Progress Notes (Signed)
? ?Acute Office Visit ? ?Subjective:  ? ? Patient ID: Jane Duncan, female    DOB: 24-Jun-1971, 51 y.o.   MRN: [id number] ? ?Chief Complaint  ?Patient presents with  ? dry mouth  ? ? ?HPI: ?Patient is in today for dry lips/mouth, dry tongue and back of lips. Does not ever fully go away. Feels like gums are swollen. Saw dentist and did not see anything of concern.  ?ON mucinex for cough but started prior to start ? ?Past Medical History:  ?Diagnosis Date  ? COVID-19 08/15/2021  ? Kidney stone   ? ? ?Past Surgical History:  ?Procedure Laterality Date  ? ABDOMINAL HYSTERECTOMY    ? Unsure if abdominal or vaginal or if total or partial.   ? CESAREAN SECTION    ? X #  ? ? ?Family History  ?Problem Relation Age of Onset  ? CAD Other   ? Hypertension Other   ? CAD Father   ? Hypertension Brother   ? ? ?Social History  ? ?Socioeconomic History  ? Marital status: Married  ?  Spouse name: Not on file  ? Number of children: Not on file  ? Years of education: Not on file  ? Highest education level: Not on file  ?Occupational History  ? Occupation: home maker  ?Tobacco Use  ? Smoking status: Never  ? Smokeless tobacco: Never  ?Substance and Sexual Activity  ? Alcohol use: Yes  ?  Alcohol/week: 14.0 standard drinks  ?  Types: 14 Glasses of wine per week  ?  Comment: 1-2 glasses of wine per night.  ? Drug use: Never  ? Sexual activity: Yes  ?Other Topics Concern  ? Not on file  ?Social History Narrative  ? Not on file  ? ?Social Determinants of Health  ? ?Financial Resource Strain: Not on file  ?Food Insecurity: Not on file  ?Transportation Needs: Not on file  ?Physical Activity: Not on file  ?Stress: Not on file  ?Social Connections: Not on file  ?Intimate Partner Violence: Not on file  ? ? ?Outpatient Medications Prior to Visit  ?Medication Sig Dispense Refill  ? nystatin (MYCOSTATIN) 100000 UNIT/ML suspension Take 1 mL by mouth 4 (four) times daily.    ? nitrofurantoin, macrocrystal-monohydrate, (MACROBID) 100 MG capsule Take  1 capsule (100 mg total) by mouth 2 (two) times daily. 14 capsule 0  ? traZODone (DESYREL) 50 MG tablet Take 0.5-1 tablets (25-50 mg total) by mouth at bedtime as needed for sleep. 30 tablet 0  ? ?No facility-administered medications prior to visit.  ? ? ?Allergies  ?Allergen Reactions  ? Ciprofloxacin   ?  MALAISE  ? Erythromycin Base Rash  ? ? ?Review of Systems  ?Constitutional:  Negative for chills and fever.  ?HENT:    ?     Dry mouth   ?Respiratory:  Positive for cough.   ? ?   ?Objective:  ?  ?Physical Exam ?Vitals reviewed.  ?Constitutional:   ?   Appearance: Normal appearance.  ?HENT:  ?   Right Ear: Tympanic membrane, ear canal and external ear normal.  ?   Left Ear: Tympanic membrane, ear canal and external ear normal.  ?   Nose: Nose normal.  ?   Mouth/Throat:  ?   Pharynx: Oropharynx is clear.  ?   Comments: Lips dry ?Cardiovascular:  ?   Rate and Rhythm: Normal rate and regular rhythm.  ?   Heart sounds: Normal heart sounds. No murmur heard. ?Pulmonary:  ?  Effort: Pulmonary effort is normal. No respiratory distress.  ?   Breath sounds: Normal breath sounds.  ?Lymphadenopathy:  ?   Cervical: No cervical adenopathy.  ?Neurological:  ?   Mental Status: She is alert and oriented to person, place, and time.  ?Psychiatric:     ?   Mood and Affect: Mood normal.     ?   Behavior: Behavior normal.  ? ? ?BP 110/68   Pulse 78   Temp (!) 96.4 ?F (35.8 ?C)   Resp 18   Ht 5' 3"  (1.6 m)   Wt 146 lb 3.2 oz (66.3 kg)   LMP  (LMP Unknown)   BMI 25.90 kg/m?  ?Wt Readings from Last 3 Encounters:  ?02/10/22 146 lb 3.2 oz (66.3 kg)  ?12/29/21 146 lb (66.2 kg)  ?10/17/21 147 lb (66.7 kg)  ? ? ?Health Maintenance Due  ?Topic Date Due  ? TETANUS/TDAP  Never done  ? COLONOSCOPY (Pts 45-20yr Insurance coverage will need to be confirmed)  Never done  ? COVID-19 Vaccine (3 - Booster for [employer] series) 08/06/2020  ? MAMMOGRAM  Never done  ? Zoster Vaccines- Shingrix (2 of 2) 09/29/2021  ? ? ?There are no preventive care  reminders to display for this patient. ? ? ?Lab Results  ?Component Value Date  ? TSH 1.950 02/10/2022  ? ?Lab Results  ?Component Value Date  ? WBC 6.9 02/10/2022  ? HGB 14.0 02/10/2022  ? HCT 42.3 02/10/2022  ? MCV 89 02/10/2022  ? PLT 303 02/10/2022  ? ?Lab Results  ?Component Value Date  ? NA 139 02/10/2022  ? K 4.6 02/10/2022  ? CO2 21 02/10/2022  ? GLUCOSE 90 02/10/2022  ? BUN 10 02/10/2022  ? CREATININE 0.75 02/10/2022  ? BILITOT 0.3 02/10/2022  ? ALKPHOS 69 02/10/2022  ? AST 14 02/10/2022  ? ALT 13 02/10/2022  ? PROT 6.8 02/10/2022  ? ALBUMIN 4.6 02/10/2022  ? CALCIUM 9.2 02/10/2022  ? EGFR 96 02/10/2022  ? ?Lab Results  ?Component Value Date  ? CHOL 214 (H) 08/04/2021  ? ?Lab Results  ?Component Value Date  ? HDL 104 08/04/2021  ? ?Lab Results  ?Component Value Date  ? LDLCALC 101 (H) 08/04/2021  ? ?Lab Results  ?Component Value Date  ? TRIG 49 08/04/2021  ? ?Lab Results  ?Component Value Date  ? CHOLHDL 2.1 08/04/2021  ? ?No results found for: HGBA1C ? ?   ?Assessment & Plan:  ? ?Problem List Items Addressed This Visit   ? ?  ? Digestive  ? Xerostomia - Primary  ?  Check labs.  ?Recommend biotin. ?  ?  ? Relevant Orders  ? CBC with Differential/Platelet (Completed)  ? TSH (Completed)  ? Sjogren's syndrome antibods(ssa + ssb) (Completed)  ? Comprehensive metabolic panel (Completed)  ? ?Meds ordered this encounter  ?Medications  ? montelukast (SINGULAIR) 10 MG tablet  ?  Sig: Take 1 tablet (10 mg total) by mouth at bedtime.  ?  Dispense:  30 tablet  ?  Refill:  3  ? ? ?Orders Placed This Encounter  ?Procedures  ? CBC with Differential/Platelet  ? TSH  ? Sjogren's syndrome antibods(ssa + ssb)  ? Comprehensive metabolic panel  ?  ? ?Follow-up: No follow-ups on file. ? ?An After Visit Summary was printed and given to the patient. ? ?KRochel Brome MD ?[facility]?([phone]?

## 2022-02-10 NOTE — Patient Instructions (Signed)
Biotin otc.  ?Recommend singulair 10 mg once daily. ?

## 2022-02-11 LAB — SJOGREN'S SYNDROME ANTIBODS(SSA + SSB)
ENA SSA (RO) Ab: <0.2 AI (ref 0.0–0.9)
ENA SSB (LA) Ab: <0.2 AI (ref 0.0–0.9)

## 2022-02-11 LAB — CBC WITH DIFFERENTIAL/PLATELET
Basophils Absolute: 0.1 10*3/uL (ref 0.0–0.2)
Basos: 1 %
EOS (ABSOLUTE): 0.2 10*3/uL (ref 0.0–0.4)
Eos: 4 %
Hematocrit: 42.3 % (ref 34.0–46.6)
Hemoglobin: 14 g/dL (ref 11.1–15.9)
Immature Grans (Abs): 0 10*3/uL (ref 0.0–0.1)
Immature Granulocytes: 0 %
Lymphocytes Absolute: 2.8 10*3/uL (ref 0.7–3.1)
Lymphs: 40 %
MCH: 29.4 pg (ref 26.6–33.0)
MCHC: 33.1 g/dL (ref 31.5–35.7)
MCV: 89 fL (ref 79–97)
Monocytes Absolute: 0.8 10*3/uL (ref 0.1–0.9)
Monocytes: 11 %
Neutrophils Absolute: 3.1 10*3/uL (ref 1.4–7.0)
Neutrophils: 44 %
Platelets: 303 10*3/uL (ref 150–450)
RBC: 4.76 x10E6/uL (ref 3.77–5.28)
RDW: 12.8 % (ref 11.7–15.4)
WBC: 6.9 10*3/uL (ref 3.4–10.8)

## 2022-02-11 LAB — COMPREHENSIVE METABOLIC PANEL
ALT: 13 IU/L (ref 0–32)
AST: 14 IU/L (ref 0–40)
Albumin/Globulin Ratio: 2.1 (ref 1.2–2.2)
Albumin: 4.6 g/dL (ref 3.8–4.9)
Alkaline Phosphatase: 69 IU/L (ref 44–121)
BUN/Creatinine Ratio: 13 (ref 9–23)
BUN: 10 mg/dL (ref 6–24)
Bilirubin Total: 0.3 mg/dL (ref 0.0–1.2)
CO2: 21 mmol/L (ref 20–29)
Calcium: 9.2 mg/dL (ref 8.7–10.2)
Chloride: 104 mmol/L (ref 96–106)
Creatinine, Ser: 0.75 mg/dL (ref 0.57–1.00)
Globulin, Total: 2.2 g/dL (ref 1.5–4.5)
Glucose: 90 mg/dL (ref 70–99)
Potassium: 4.6 mmol/L (ref 3.5–5.2)
Sodium: 139 mmol/L (ref 134–144)
Total Protein: 6.8 g/dL (ref 6.0–8.5)
eGFR: 96 mL/min/{1.73_m2} (ref >59)

## 2022-02-11 LAB — TSH: TSH: 1.95 u[IU]/mL (ref 0.450–4.500)

## 2022-02-12 DIAGNOSIS — K117 Disturbances of salivary secretion: Secondary | ICD-10-CM | POA: Insufficient documentation

## 2022-02-12 NOTE — Assessment & Plan Note (Signed)
Check labs.  ?Recommend biotin. ?

## 2022-08-14 ENCOUNTER — Encounter: Payer: Self-pay | Admitting: Family Medicine

## 2022-08-14 ENCOUNTER — Ambulatory Visit: Payer: BC Managed Care – PPO | Admitting: Family Medicine

## 2022-08-14 VITALS — BP 128/80 | HR 65 | Temp 97.4°F | Ht 63.0 in | Wt 148.0 lb

## 2022-08-14 DIAGNOSIS — J029 Acute pharyngitis, unspecified: Secondary | ICD-10-CM

## 2022-08-14 DIAGNOSIS — J069 Acute upper respiratory infection, unspecified: Secondary | ICD-10-CM | POA: Diagnosis not present

## 2022-08-14 LAB — POCT RAPID STREP A (OFFICE): Rapid Strep A Screen: NEGATIVE

## 2022-08-14 LAB — POC COVID19 BINAXNOW: SARS Coronavirus 2 Ag: NEGATIVE

## 2022-08-14 NOTE — Progress Notes (Unsigned)
Acute Office Visit  Subjective:    Patient ID: Jane Duncan, female    DOB: [birth year], 51 y.o.   MRN: [id number]  Chief Complaint  Patient presents with   Sore Throat   EAR PAIN    Sore Throat  Associated symptoms include ear pain. Pertinent negatives include no abdominal pain, congestion, coughing, diarrhea, headaches, shortness of breath or vomiting.   Patient is in today for earache and sore throat.  Symptoms been going on for few days.  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  Denied symptoms as stated above.  COVID-19 test done today in our parking lot was negative.  Strep test came back negative also.  Past Medical History:  Diagnosis Date   COVID-19 08/15/2021   Kidney stone     Past Surgical History:  Procedure Laterality Date   ABDOMINAL HYSTERECTOMY     Unsure if abdominal or vaginal or if total or partial.    CESAREAN SECTION     X #    Family History  Problem Relation Age of Onset   CAD Other    Hypertension Other    CAD Father    Hypertension Brother     Social History   Socioeconomic History   Marital status: Married    Spouse name: Not on file   Number of children: Not on file   Years of education: Not on file   Highest education level: Not on file  Occupational History   Occupation: home maker  Tobacco Use   Smoking status: Never   Smokeless tobacco: Never  Substance and Sexual Activity   Alcohol use: Yes    Alcohol/week: 14.0 standard drinks of alcohol    Types: 14 Glasses of wine per week    Comment: 1-2 glasses of wine per night.   Drug use: Never   Sexual activity: Yes  Other Topics Concern   Not on file  Social History Narrative   Not on file   Social Determinants of Health   Financial Resource Strain: Not on file  Food Insecurity: Not on file  Transportation Needs: Not on file  Physical Activity: Not on file  Stress: Not on file  Social Connections: Not on file  Intimate Partner  Violence: Not on file    Outpatient Medications Prior to Visit  Medication Sig Dispense Refill   montelukast (SINGULAIR) 10 MG tablet Take 1 tablet (10 mg total) by mouth at bedtime. 30 tablet 3   nystatin (MYCOSTATIN) 100000 UNIT/ML suspension Take 1 mL by mouth 4 (four) times daily.     No facility-administered medications prior to visit.    Allergies  Allergen Reactions   Ciprofloxacin     MALAISE   Erythromycin Base Rash    Review of Systems  Constitutional:  Negative for appetite change, fatigue and fever.  HENT:  Positive for ear pain and sore throat. Negative for congestion and sinus pressure.   Respiratory:  Negative for cough, chest tightness, shortness of breath and wheezing.   Cardiovascular:  Negative for chest pain and palpitations.  Gastrointestinal:  Negative for abdominal pain, constipation, diarrhea, nausea and vomiting.  Genitourinary:  Negative for dysuria and hematuria.  Musculoskeletal:  Negative for arthralgias, back pain, joint swelling and myalgias.  Skin:  Negative for rash.  Neurological:  Negative for dizziness, weakness and headaches.  Psychiatric/Behavioral:  Negative for dysphoric mood. The patient is not nervous/anxious.        Objective:    Physical  Exam Vitals reviewed.  Constitutional:      Appearance: Normal appearance. She is normal weight.  HENT:     Right Ear: Tympanic membrane, ear canal and external ear normal.     Left Ear: Tympanic membrane, ear canal and external ear normal.     Nose: Congestion present.     Mouth/Throat:     Pharynx: Posterior oropharyngeal erythema present. No oropharyngeal exudate.  Cardiovascular:     Rate and Rhythm: Normal rate and regular rhythm.     Pulses: Normal pulses.     Heart sounds: Normal heart sounds. No murmur heard. Pulmonary:     Effort: Pulmonary effort is normal. No respiratory distress.     Breath sounds: Normal breath sounds.  Abdominal:     General: Abdomen is flat.  Neurological:      Mental Status: She is alert and oriented to person, place, and time.  Psychiatric:        Mood and Affect: Mood normal.        Behavior: Behavior normal.     BP 128/80 (BP Location: Left Arm, Patient Position: Sitting)   Pulse 65   Temp (!) 97.4 F (36.3 C) (Temporal)   Ht $R'5\' 3"'ei$  (1.6 m)   Wt 148 lb (67.1 kg)   LMP  (LMP Unknown)   SpO2 97%   BMI 26.22 kg/m  Wt Readings from Last 3 Encounters:  08/14/22 148 lb (67.1 kg)  02/10/22 146 lb 3.2 oz (66.3 kg)  12/29/21 146 lb (66.2 kg)    Health Maintenance Due  Topic Date Due   TETANUS/TDAP  Never done   COLONOSCOPY (Pts 45-70yrs Insurance coverage will need to be confirmed)  Never done   COVID-19 Vaccine (3 - Pfizer series) 08/06/2020   MAMMOGRAM  Never done   Zoster Vaccines- Shingrix (2 of 2) 09/29/2021    There are no preventive care reminders to display for this patient.   Lab Results  Component Value Date   TSH 1.950 02/10/2022   Lab Results  Component Value Date   WBC 6.9 02/10/2022   HGB 14.0 02/10/2022   HCT 42.3 02/10/2022   MCV 89 02/10/2022   PLT 303 02/10/2022   Lab Results  Component Value Date   NA 139 02/10/2022   K 4.6 02/10/2022   CO2 21 02/10/2022   GLUCOSE 90 02/10/2022   BUN 10 02/10/2022   CREATININE 0.75 02/10/2022   BILITOT 0.3 02/10/2022   ALKPHOS 69 02/10/2022   AST 14 02/10/2022   ALT 13 02/10/2022   PROT 6.8 02/10/2022   ALBUMIN 4.6 02/10/2022   CALCIUM 9.2 02/10/2022   EGFR 96 02/10/2022   Lab Results  Component Value Date   CHOL 214 (H) 08/04/2021   Lab Results  Component Value Date   HDL 104 08/04/2021   Lab Results  Component Value Date   LDLCALC 101 (H) 08/04/2021   Lab Results  Component Value Date   TRIG 49 08/04/2021   Lab Results  Component Value Date   CHOLHDL 2.1 08/04/2021   No results found for: "HGBA1C"       Assessment & Plan:   Problem List Items Addressed This Visit       Respiratory   Upper respiratory infection, acute - Primary     COVID-19 negative.  Rapid strep negative. Recommended over-the-counter nasal congestion and cough medicines. If worsening towards the end of the week may call for an antibiotic.  At this point I do not feel it is  necessary      Relevant Orders   POC COVID-19 BinaxNow (Completed)   POCT rapid strep A (Completed)    Follow-up as needed I,Lauren M Auman,acting as a scribe for Rochel Brome, MD.,have documented all relevant documentation on the behalf of Rochel Brome, MD,as directed by  Rochel Brome, MD while in the presence of Rochel Brome, MD.   Rochel Brome, MD

## 2022-08-17 ENCOUNTER — Ambulatory Visit: Payer: Managed Care, Other (non HMO) | Admitting: Family Medicine

## 2022-08-17 DIAGNOSIS — J069 Acute upper respiratory infection, unspecified: Secondary | ICD-10-CM | POA: Insufficient documentation

## 2022-08-17 NOTE — Assessment & Plan Note (Signed)
COVID-19 negative.  Rapid strep negative. Recommended over-the-counter nasal congestion and cough medicines. If worsening towards the end of the week may call for an antibiotic.  At this point I do not feel it is necessary

## 2022-08-21 ENCOUNTER — Other Ambulatory Visit: Payer: Self-pay | Admitting: Family Medicine

## 2022-08-21 ENCOUNTER — Telehealth: Payer: Self-pay

## 2022-08-21 MED ORDER — AMOXICILLIN 875 MG PO TABS: 875.0000 mg | ORAL_TABLET | Freq: Two times a day (BID) | ORAL | 0 refills | Status: AC

## 2022-08-21 NOTE — Telephone Encounter (Signed)
Left detailed message informing patient of prescription being send to pharmacy.

## 2022-08-21 NOTE — Telephone Encounter (Signed)
Patient called stating that she was seen last Tuesday and was tested for strep and covid and all was negative, and she states that she has been taking her allergy medication as instructed and she states that things just haven't gotten any better, she states that her ears feel clogged. Please advise.

## 2022-08-21 NOTE — Telephone Encounter (Signed)
Patient made aware, verbalized understanding

## 2022-09-13 ENCOUNTER — Encounter: Payer: Self-pay | Admitting: Nurse Practitioner

## 2022-09-13 ENCOUNTER — Ambulatory Visit: Payer: BC Managed Care – PPO | Admitting: Nurse Practitioner

## 2022-09-13 VITALS — BP 118/84 | HR 67 | Temp 96.7°F | Ht 63.0 in | Wt 147.0 lb

## 2022-09-13 DIAGNOSIS — N3001 Acute cystitis with hematuria: Secondary | ICD-10-CM

## 2022-09-13 LAB — POCT URINALYSIS DIP (CLINITEK)
Bilirubin, UA: NEGATIVE
Glucose, UA: NEGATIVE mg/dL
Ketones, POC UA: NEGATIVE mg/dL
Nitrite, UA: NEGATIVE
POC PROTEIN,UA: NEGATIVE
Spec Grav, UA: <=1.005 — AB (ref 1.010–1.025)
Urobilinogen, UA: 0.2 E.U./dL
pH, UA: 7.5 (ref 5.0–8.0)

## 2022-09-13 MED ORDER — NITROFURANTOIN MONOHYD MACRO 100 MG PO CAPS: 100.0000 mg | ORAL_CAPSULE | Freq: Two times a day (BID) | ORAL | 0 refills | Status: DC

## 2022-09-13 NOTE — Progress Notes (Signed)
Acute Office Visit  Subjective:    Patient ID: Jane Duncan, female    DOB: 1971/07/31, 51 y.o.   MRN: [id number]  Chief Complaint  Patient presents with   Urinary Tract Infection    HPI: Patient is in today for Urinary symptoms  She reports new onset dysuria, urinary frequency, and urinary hesitancy. Denies fever, chills, abd pain, or flank pain.The current episode started about a week ago and is staying constant. Patient states symptoms are moderate in intensity, occurring intermittently. She  has not been recently treated for similar symptoms. Treatment has included pushing fluids.   Past Medical History:  Diagnosis Date   COVID-19 08/15/2021   Kidney stone     Past Surgical History:  Procedure Laterality Date   ABDOMINAL HYSTERECTOMY     Unsure if abdominal or vaginal or if total or partial.    CESAREAN SECTION     X #    Family History  Problem Relation Age of Onset   CAD Other    Hypertension Other    CAD Father    Hypertension Brother     Social History   Socioeconomic History   Marital status: Married    Spouse name: Not on file   Number of children: Not on file   Years of education: Not on file   Highest education level: Not on file  Occupational History   Occupation: home maker  Tobacco Use   Smoking status: Never   Smokeless tobacco: Never  Substance and Sexual Activity   Alcohol use: Yes    Alcohol/week: 14.0 standard drinks of alcohol    Types: 14 Glasses of wine per week    Comment: 1-2 glasses of wine per night.   Drug use: Never   Sexual activity: Yes  Other Topics Concern   Not on file  Social History Narrative   Not on file   Social Determinants of Health   Financial Resource Strain: Not on file  Food Insecurity: Not on file  Transportation Needs: Not on file  Physical Activity: Not on file  Stress: Not on file  Social Connections: Not on file  Intimate Partner Violence: Not on file    No outpatient medications prior to  visit.   No facility-administered medications prior to visit.    Allergies  Allergen Reactions   Ciprofloxacin     MALAISE   Erythromycin Base Rash    Review of Systems See pertinent positives and negatives per HPI.     Objective:    Physical Exam Vitals reviewed.  Constitutional:      Appearance: Normal appearance.  Pulmonary:     Effort: Pulmonary effort is normal.  Skin:    General: Skin is warm and dry.     Capillary Refill: Capillary refill takes less than 2 seconds.  Neurological:     Mental Status: She is alert.     BP 118/84   Pulse 67   Temp (!) 96.7 F (35.9 C)   Ht 5' 3" (1.6 m)   Wt 147 lb (66.7 kg)   LMP  (LMP Unknown)   SpO2 99%   BMI 26.04 kg/m   Wt Readings from Last 3 Encounters:  09/13/22 147 lb (66.7 kg)  08/14/22 148 lb (67.1 kg)  02/10/22 146 lb 3.2 oz (66.3 kg)    Health Maintenance Due  Topic Date Due   HIV Screening  Never done   Hepatitis C Screening  Never done   TETANUS/TDAP  Never done  COLONOSCOPY (Pts 45-25yr Insurance coverage will need to be confirmed)  Never done   COVID-19 Vaccine (3 - Pfizer series) 08/06/2020   MAMMOGRAM  Never done   Zoster Vaccines- Shingrix (2 of 2) 09/29/2021       Lab Results  Component Value Date   TSH 1.950 02/10/2022   Lab Results  Component Value Date   WBC 6.9 02/10/2022   HGB 14.0 02/10/2022   HCT 42.3 02/10/2022   MCV 89 02/10/2022   PLT 303 02/10/2022   Lab Results  Component Value Date   NA 139 02/10/2022   K 4.6 02/10/2022   CO2 21 02/10/2022   GLUCOSE 90 02/10/2022   BUN 10 02/10/2022   CREATININE 0.75 02/10/2022   BILITOT 0.3 02/10/2022   ALKPHOS 69 02/10/2022   AST 14 02/10/2022   ALT 13 02/10/2022   PROT 6.8 02/10/2022   ALBUMIN 4.6 02/10/2022   CALCIUM 9.2 02/10/2022   EGFR 96 02/10/2022   Lab Results  Component Value Date   CHOL 214 (H) 08/04/2021   Lab Results  Component Value Date   HDL 104 08/04/2021   Lab Results  Component Value Date    LDLCALC 101 (H) 08/04/2021   Lab Results  Component Value Date   TRIG 49 08/04/2021   Lab Results  Component Value Date   CHOLHDL 2.1 08/04/2021        Assessment & Plan:   1. Acute cystitis with hematuria - POCT URINALYSIS DIP (CLINITEK) - Urine Culture - nitrofurantoin, macrocrystal-monohydrate, (MACROBID) 100 MG capsule; Take 1 capsule (100 mg total) by mouth 2 (two) times daily.  Dispense: 14 capsule; Refill: 0       Take Macrobid twice daily for 7 days, with food Push fluids, especially water Notify office immediately of any worsening symptoms Follow-up as needed  Follow-up: PRN  An After Visit Summary was printed and given to the patient.  I, SRip Harbour NP, have reviewed all documentation for this visit. The documentation on 09/13/22 for the exam, diagnosis, procedures, and orders are all accurate and complete.   Signed, SRip Harbour NP [facility]([phone]

## 2022-09-13 NOTE — Patient Instructions (Signed)
Take Macrobid twice daily for 7 days, with food Push fluids, especially water Notify office immediately of any worsening symptoms Follow-up as needed    Urinary Tract Infection, Adult A urinary tract infection (UTI) is an infection of any part of the urinary tract. The urinary tract includes: The kidneys. The ureters. The bladder. The urethra. These organs make, store, and get rid of pee (urine) in the body. What are the causes? This infection is caused by germs (bacteria) in your genital area. These germs grow and cause swelling (inflammation) of your urinary tract. What increases the risk? The following factors may make you more likely to develop this condition: Using a small, thin tube (catheter) to drain pee. Not being able to control when you pee or poop (incontinence). Being female. If you are female, these things can increase the risk: Using these methods to prevent pregnancy: A medicine that kills sperm (spermicide). A device that blocks sperm (diaphragm). Having low levels of a female hormone (estrogen). Being pregnant. You are more likely to develop this condition if: You have genes that add to your risk. You are sexually active. You take antibiotic medicines. You have trouble peeing because of: A prostate that is bigger than normal, if you are female. A blockage in the part of your body that drains pee from the bladder. A kidney stone. A nerve condition that affects your bladder. Not getting enough to drink. Not peeing often enough. You have other conditions, such as: Diabetes. A weak disease-fighting system (immune system). Sickle cell disease. Gout. Injury of the spine. What are the signs or symptoms? Symptoms of this condition include: Needing to pee right away. Peeing small amounts often. Pain or burning when peeing. Blood in the pee. Pee that smells bad or not like normal. Trouble peeing. Pee that is cloudy. Fluid coming from the vagina, if you are  female. Pain in the belly or lower back. Other symptoms include: Vomiting. Not feeling hungry. Feeling mixed up (confused). This may be the first symptom in older adults. Being tired and grouchy (irritable). A fever. Watery poop (diarrhea). How is this treated? Taking antibiotic medicine. Taking other medicines. Drinking enough water. In some cases, you may need to see a specialist. Follow these instructions at home:  Medicines Take over-the-counter and prescription medicines only as told by your doctor. If you were prescribed an antibiotic medicine, take it as told by your doctor. Do not stop taking it even if you start to feel better. General instructions Make sure you: Pee until your bladder is empty. Do not hold pee for a long time. Empty your bladder after sex. Wipe from front to back after peeing or pooping if you are a female. Use each tissue one time when you wipe. Drink enough fluid to keep your pee pale yellow. Keep all follow-up visits. Contact a doctor if: You do not get better after 1-2 days. Your symptoms go away and then come back. Get help right away if: You have very bad back pain. You have very bad pain in your lower belly. You have a fever. You have chills. You feeling like you will vomit or you vomit. Summary A urinary tract infection (UTI) is an infection of any part of the urinary tract. This condition is caused by germs in your genital area. There are many risk factors for a UTI. Treatment includes antibiotic medicines. Drink enough fluid to keep your pee pale yellow. This information is not intended to replace advice given to you by your  health care provider. Make sure you discuss any questions you have with your health care provider. Document Revised: 06/11/2020 Document Reviewed: 06/11/2020 Elsevier Patient Education  [place].

## 2022-09-14 ENCOUNTER — Telehealth: Payer: Self-pay

## 2022-09-14 NOTE — Telephone Encounter (Signed)
Patient called and stated she seen you yesterday, she said she feels her uti symptoms are better but she is having some discomfort in her lower back and you told her to call office if symptoms didn't get better after she finish antibiotics.   Per Larene Beach: Can order CT if patient thinks it is kidney stones.  Patient made aware, stated that her pain is not as bad as it was when she had stones, just a little discomfort and she will call if pain gets worst right now, she will see if it goes away when her UTI gets better. She don't think she needs a CT right now

## 2022-09-15 LAB — URINE CULTURE: Organism ID, Bacteria: NO GROWTH

## 2022-09-19 ENCOUNTER — Ambulatory Visit: Payer: BC Managed Care – PPO | Admitting: Family Medicine

## 2022-09-19 ENCOUNTER — Encounter: Payer: Self-pay | Admitting: Family Medicine

## 2022-09-19 VITALS — BP 128/88 | HR 79 | Temp 97.9°F | Ht 63.0 in | Wt 146.0 lb

## 2022-09-19 DIAGNOSIS — J0100 Acute maxillary sinusitis, unspecified: Secondary | ICD-10-CM | POA: Diagnosis not present

## 2022-09-19 LAB — POC COVID19 BINAXNOW: SARS Coronavirus 2 Ag: NEGATIVE

## 2022-09-19 MED ORDER — BENZONATATE 200 MG PO CAPS: 200.0000 mg | ORAL_CAPSULE | Freq: Three times a day (TID) | ORAL | 0 refills | Status: DC | PRN

## 2022-09-19 MED ORDER — AMOXICILLIN-POT CLAVULANATE 875-125 MG PO TABS: 1.0000 | ORAL_TABLET | Freq: Two times a day (BID) | ORAL | 0 refills | Status: DC

## 2022-09-19 NOTE — Assessment & Plan Note (Signed)
-   symptoms and exam c/w sinusitis   - no evidence of AOM, CAP, strep pharyngitis, or other infection - given duration of symptoms, suspect bacterial etiology - will treat with Augmentin 875 mg one twice daily x 10 days - discussed symptomatic management (flonase, decongestants, etc), natural course, and return precautions

## 2022-09-19 NOTE — Progress Notes (Signed)
Acute Office Visit  Subjective:    Patient ID: Jane Duncan, female    DOB: [birth year], 51 y.o.   MRN: [id number]  Chief Complaint  Patient presents with   Cough    HPI: Patient is in today for nasal congestion, sore throat, cough x 1 day. No fever.  Just completing UTI abx (macrobid.)  Past Medical History:  Diagnosis Date   COVID-19 08/15/2021   Kidney stone     Past Surgical History:  Procedure Laterality Date   ABDOMINAL HYSTERECTOMY     Unsure if abdominal or vaginal or if total or partial.    CESAREAN SECTION     X #    Family History  Problem Relation Age of Onset   CAD Other    Hypertension Other    CAD Father    Hypertension Brother     Social History   Socioeconomic History   Marital status: Married    Spouse name: Not on file   Number of children: Not on file   Years of education: Not on file   Highest education level: Not on file  Occupational History   Occupation: home maker  Tobacco Use   Smoking status: Never   Smokeless tobacco: Never  Substance and Sexual Activity   Alcohol use: Yes    Alcohol/week: 14.0 standard drinks of alcohol    Types: 14 Glasses of wine per week    Comment: 1-2 glasses of wine per night.   Drug use: Never   Sexual activity: Yes  Other Topics Concern   Not on file  Social History Narrative   Not on file   Social Determinants of Health   Financial Resource Strain: Not on file  Food Insecurity: Not on file  Transportation Needs: Not on file  Physical Activity: Not on file  Stress: Not on file  Social Connections: Not on file  Intimate Partner Violence: Not on file    Outpatient Medications Prior to Visit  Medication Sig Dispense Refill   nitrofurantoin, macrocrystal-monohydrate, (MACROBID) 100 MG capsule Take 1 capsule (100 mg total) by mouth 2 (two) times daily. 14 capsule 0   No facility-administered medications prior to visit.    Allergies  Allergen Reactions   Ciprofloxacin     MALAISE    Erythromycin Base Rash    Review of Systems  Constitutional:  Negative for chills, fatigue and fever.  HENT:  Positive for congestion, sinus pressure, sinus pain and sore throat. Negative for ear pain.   Respiratory:  Positive for cough. Negative for shortness of breath.   Cardiovascular:  Negative for chest pain.  Neurological:  Positive for headaches.       Objective:    Physical Exam Vitals reviewed.  Constitutional:      Appearance: Normal appearance.  HENT:     Right Ear: Tympanic membrane, ear canal and external ear normal.     Left Ear: Tympanic membrane, ear canal and external ear normal.     Nose: Nose normal.     Comments: Tender sinuses BL.    Mouth/Throat:     Pharynx: Oropharynx is clear.  Cardiovascular:     Rate and Rhythm: Normal rate and regular rhythm.     Heart sounds: Normal heart sounds. No murmur heard. Pulmonary:     Effort: Pulmonary effort is normal. No respiratory distress.     Breath sounds: Normal breath sounds.  Lymphadenopathy:     Cervical: No cervical adenopathy (tender BL.).  Neurological:  Mental Status: She is alert and oriented to person, place, and time.  Psychiatric:        Mood and Affect: Mood normal.        Behavior: Behavior normal.     BP 128/88 (BP Location: Left Arm, Patient Position: Sitting)   Pulse 79   Temp 97.9 F (36.6 C) (Temporal)   Ht _0  (1.6 m)   Wt 146 lb (66.2 kg)   LMP  (LMP Unknown)   SpO2 98%   BMI 25.86 kg/m  Wt Readings from Last 3 Encounters:  09/19/22 146 lb (66.2 kg)  09/13/22 147 lb (66.7 kg)  08/14/22 148 lb (67.1 kg)    Health Maintenance Due  Topic Date Due   HIV Screening  Never done   Hepatitis C Screening  Never done   TETANUS/TDAP  Never done   COLONOSCOPY (Pts 45-74yr Insurance coverage will need to be confirmed)  Never done   COVID-19 Vaccine (3 - Pfizer series) 08/06/2020   MAMMOGRAM  Never done   Zoster Vaccines- Shingrix (2 of 2) 09/29/2021    There are no  preventive care reminders to display for this patient.   Lab Results  Component Value Date   TSH 1.950 02/10/2022   Lab Results  Component Value Date   WBC 6.9 02/10/2022   HGB 14.0 02/10/2022   HCT 42.3 02/10/2022   MCV 89 02/10/2022   PLT 303 02/10/2022   Lab Results  Component Value Date   NA 139 02/10/2022   K 4.6 02/10/2022   CO2 21 02/10/2022   GLUCOSE 90 02/10/2022   BUN 10 02/10/2022   CREATININE 0.75 02/10/2022   BILITOT 0.3 02/10/2022   ALKPHOS 69 02/10/2022   AST 14 02/10/2022   ALT 13 02/10/2022   PROT 6.8 02/10/2022   ALBUMIN 4.6 02/10/2022   CALCIUM 9.2 02/10/2022   EGFR 96 02/10/2022   Lab Results  Component Value Date   CHOL 214 (H) 08/04/2021   Lab Results  Component Value Date   HDL 104 08/04/2021   Lab Results  Component Value Date   LDLCALC 101 (H) 08/04/2021   Lab Results  Component Value Date   TRIG 49 08/04/2021   Lab Results  Component Value Date   CHOLHDL 2.1 08/04/2021   No results found for: "HGBA1C"     Assessment & Plan:   Problem List Items Addressed This Visit       Respiratory   Acute non-recurrent maxillary sinusitis - Primary    - symptoms and exam c/w sinusitis   - no evidence of AOM, CAP, strep pharyngitis, or other infection - given duration of symptoms, suspect bacterial etiology - will treat with Augmentin 875 mg one twice daily x 10 days - discussed symptomatic management (flonase, decongestants, etc), natural course, and return precautions        Relevant Medications   amoxicillin-clavulanate (AUGMENTIN) 875-125 MG tablet   benzonatate (TESSALON) 200 MG capsule   Other Relevant Orders   POC COVID-19 BinaxNow (Completed)   Meds ordered this encounter  Medications   amoxicillin-clavulanate (AUGMENTIN) 875-125 MG tablet    Sig: Take 1 tablet by mouth 2 (two) times daily.    Dispense:  20 tablet    Refill:  0   benzonatate (TESSALON) 200 MG capsule    Sig: Take 1 capsule (200 mg total) by mouth 3  (three) times daily as needed for cough.    Dispense:  30 capsule    Refill:  0  Orders Placed This Encounter  Procedures   POC COVID-19 BinaxNow     Follow-up: Return if symptoms worsen or fail to improve.  An After Visit Summary was printed and given to the patient.  Rochel Brome, MD [facility] [phone]

## 2022-09-21 ENCOUNTER — Other Ambulatory Visit: Payer: Self-pay | Admitting: Family Medicine

## 2022-09-21 ENCOUNTER — Telehealth: Payer: Self-pay

## 2022-09-21 MED ORDER — HYDROCODONE BIT-HOMATROP MBR 5-1.5 MG/5ML PO SOLN: 5.0000 mL | Freq: Four times a day (QID) | ORAL | 0 refills | Status: DC | PRN

## 2022-09-21 NOTE — Telephone Encounter (Signed)
Patient made aware, verbalized understanding

## 2022-09-21 NOTE — Telephone Encounter (Signed)
Patient was seen last Monday for cough. She is still having cough and she cannot sleep. You sent a cough syrup with codeine, but she cannot take it because she feels jiggling and cannot sleep. She states that you send another cough syrup that she can take and ask if can you send it. Please advice.

## 2022-09-27 ENCOUNTER — Ambulatory Visit: Payer: BC Managed Care – PPO | Admitting: Family Medicine

## 2022-09-27 ENCOUNTER — Encounter: Payer: Self-pay | Admitting: Family Medicine

## 2022-09-27 VITALS — BP 120/72 | HR 60 | Temp 97.3°F | Resp 16 | Ht 63.0 in | Wt 147.0 lb

## 2022-09-27 DIAGNOSIS — M545 Low back pain, unspecified: Secondary | ICD-10-CM

## 2022-09-27 LAB — POCT URINALYSIS DIP (CLINITEK)
Bilirubin, UA: NEGATIVE
Blood, UA: NEGATIVE
Glucose, UA: NEGATIVE mg/dL
Ketones, POC UA: NEGATIVE mg/dL
Leukocytes, UA: NEGATIVE
Nitrite, UA: NEGATIVE
POC PROTEIN,UA: NEGATIVE
Spec Grav, UA: 1.01 (ref 1.010–1.025)
Urobilinogen, UA: 0.2 E.U./dL
pH, UA: 7 (ref 5.0–8.0)

## 2022-09-27 MED ORDER — KETOROLAC TROMETHAMINE 60 MG/2ML IM SOLN
60.0000 mg | Freq: Once | INTRAMUSCULAR | Status: AC
  Administered 2022-09-27: 60 mg via INTRAMUSCULAR

## 2022-09-27 MED ORDER — PREDNISONE 50 MG PO TABS: 50.0000 mg | ORAL_TABLET | Freq: Every day | ORAL | 0 refills | Status: DC

## 2022-09-27 NOTE — Progress Notes (Signed)
Subjective:  Patient ID: Jane Duncan, female    DOB: 1971/11/12  Age: 51 y.o. MRN: [id number]  Chief Complaint  Patient presents with   Back Pain   HPI: Back pain began 3 weeks ago. Felt like uti was coming on and off. UTI was worsening and was treated. Took longer to improve. Has continued to have back pain and gets a sensation in BL legs down in to toes.  No Losing control of bladder.     Back Pain Pertinent negatives include no abdominal pain, chest pain, dysuria or headaches.     No current outpatient medications on file prior to visit.   No current facility-administered medications on file prior to visit.   Past Medical History:  Diagnosis Date   COVID-19 08/15/2021   Kidney stone    Past Surgical History:  Procedure Laterality Date   ABDOMINAL HYSTERECTOMY     Unsure if abdominal or vaginal or if total or partial.    CESAREAN SECTION     X #    Family History  Problem Relation Age of Onset   CAD Other    Hypertension Other    CAD Father    Hypertension Brother    Social History   Socioeconomic History   Marital status: Married    Spouse name: Not on file   Number of children: Not on file   Years of education: Not on file   Highest education level: Not on file  Occupational History   Occupation: home maker  Tobacco Use   Smoking status: Never   Smokeless tobacco: Never  Substance and Sexual Activity   Alcohol use: Yes    Alcohol/week: 14.0 standard drinks of alcohol    Types: 14 Glasses of wine per week    Comment: 1-2 glasses of wine per night.   Drug use: Never   Sexual activity: Yes  Other Topics Concern   Not on file  Social History Narrative   Not on file   Social Determinants of Health   Financial Resource Strain: Not on file  Food Insecurity: Not on file  Transportation Needs: Not on file  Physical Activity: Not on file  Stress: Not on file  Social Connections: Not on file    Review of Systems  Constitutional:  Negative  for chills and fatigue.  HENT:  Negative for congestion, ear pain, sinus pain and sore throat.   Respiratory:  Negative for cough and shortness of breath.   Cardiovascular:  Negative for chest pain and leg swelling.  Gastrointestinal:  Negative for abdominal pain, constipation, diarrhea, nausea and vomiting.  Genitourinary:  Negative for dysuria and frequency.  Musculoskeletal:  Positive for back pain. Negative for arthralgias and myalgias.  Neurological:  Negative for dizziness and headaches.     Objective:  BP 120/72   Pulse 60   Temp (!) 97.3 F (36.3 C)   Resp 16   Ht 5\' 3"  (1.6 m)   Wt 147 lb (66.7 kg)   LMP  (LMP Unknown)   SpO2 98%   BMI 26.04 kg/m      09/27/2022    3:59 PM 09/19/2022   12:13 PM 09/13/2022    8:52 AM  BP/Weight  Systolic BP 120 128 118  Diastolic BP 72 88 84  Wt. (Lbs) 147 146 147  BMI 26.04 kg/m2 25.86 kg/m2 26.04 kg/m2    Physical Exam Vitals reviewed.  Constitutional:      Appearance: Normal appearance.  Cardiovascular:     Rate  and Rhythm: Normal rate and regular rhythm.     Heart sounds: Normal heart sounds.  Pulmonary:     Effort: Pulmonary effort is normal.     Breath sounds: Normal breath sounds.  Abdominal:     General: Bowel sounds are normal.     Palpations: Abdomen is soft.     Tenderness: There is abdominal tenderness (mild suprapubic).  Musculoskeletal:        General: Tenderness (lumbar right paraspinal muscles. SLR positive BL.) present.  Neurological:     Mental Status: She is alert and oriented to person, place, and time.  Psychiatric:        Mood and Affect: Mood normal.        Behavior: Behavior normal.     Diabetic Foot Exam - Simple   No data filed      Lab Results  Component Value Date   WBC 6.9 02/10/2022   HGB 14.0 02/10/2022   HCT 42.3 02/10/2022   PLT 303 02/10/2022   GLUCOSE 90 02/10/2022   CHOL 214 (H) 08/04/2021   TRIG 49 08/04/2021   HDL 104 08/04/2021   LDLCALC 101 (H) 08/04/2021   ALT  13 02/10/2022   AST 14 02/10/2022   NA 139 02/10/2022   K 4.6 02/10/2022   CL 104 02/10/2022   CREATININE 0.75 02/10/2022   BUN 10 02/10/2022   CO2 21 02/10/2022   TSH 1.950 02/10/2022   INR 1.00 11/24/2010      Assessment & Plan:   Problem List Items Addressed This Visit   None Visit Diagnoses     Acute midline low back pain, unspecified whether sciatica present    -  Primary   Relevant Medications   predniSONE (DELTASONE) 50 MG tablet   ketorolac (TORADOL) injection 60 mg   Other Relevant Orders   POCT URINALYSIS DIP (CLINITEK) (Completed)   DG Lumbar Spine Complete     .  Meds ordered this encounter  Medications   predniSONE (DELTASONE) 50 MG tablet    Sig: Take 1 tablet (50 mg total) by mouth daily with breakfast.    Dispense:  5 tablet    Refill:  0   ketorolac (TORADOL) injection 60 mg    Orders Placed This Encounter  Procedures   DG Lumbar Spine Complete   POCT URINALYSIS DIP (CLINITEK)     Follow-up: Return in about 3 weeks (around 10/18/2022) for back pain follow up. .  An After Visit Summary was printed and given to the patient.  Blane Ohara, MD [facility] [phone]

## 2022-09-27 NOTE — Patient Instructions (Signed)
Prednisone 50 mg daily x 5 days.Take with food and in morning.  May use tylenol otc as directed on bottle.  If pain does not resolve with prednisone or returns after completion of prednisone, recommend ibuprofen 800 mg three times a day  Do exercises.   If not getting better, call and I will send you to physical therapy after thanksgiving.

## 2023-01-01 ENCOUNTER — Ambulatory Visit (INDEPENDENT_AMBULATORY_CARE_PROVIDER_SITE_OTHER): Payer: BC Managed Care – PPO | Admitting: Family Medicine

## 2023-01-01 ENCOUNTER — Other Ambulatory Visit: Payer: Self-pay | Admitting: Family Medicine

## 2023-01-01 DIAGNOSIS — Z13220 Encounter for screening for lipoid disorders: Secondary | ICD-10-CM

## 2023-01-01 DIAGNOSIS — N959 Unspecified menopausal and perimenopausal disorder: Secondary | ICD-10-CM

## 2023-01-01 DIAGNOSIS — R5383 Other fatigue: Secondary | ICD-10-CM

## 2023-01-01 DIAGNOSIS — U071 COVID-19: Secondary | ICD-10-CM | POA: Diagnosis not present

## 2023-01-01 DIAGNOSIS — R051 Acute cough: Secondary | ICD-10-CM

## 2023-01-01 LAB — POC COVID19 BINAXNOW: SARS Coronavirus 2 Ag: POSITIVE — AB

## 2023-01-01 MED ORDER — MOLNUPIRAVIR EUA 200MG CAPSULE: 4.0000 | ORAL_CAPSULE | Freq: Two times a day (BID) | ORAL | 0 refills | Status: DC

## 2023-01-01 MED ORDER — MOLNUPIRAVIR EUA 200MG CAPSULE: 4.0000 | ORAL_CAPSULE | Freq: Two times a day (BID) | ORAL | 0 refills | Status: AC

## 2023-01-01 NOTE — Patient Instructions (Addendum)
Your COVID test is positive. You should remain isolated and quarantine for at least 5 days from start of symptoms. You must be feeling better and be fever free without any fever reducers for at least 24 hours as well. You should wear a mask at all times when out of your home or around others for 5 days after leaving isolation.  Your household contacts should be tested as well as work contacts. If you feel worse or have increasing shortness of breath, you should be seen in person at urgent care or the emergency room.   Rx: molnupiravir sent. Recommend rest, fluids, 3 meals per day.  Fever reducing medicines.  OTC cold and congestion medicines.

## 2023-01-01 NOTE — Progress Notes (Unsigned)
Acute Office Visit  Subjective:    Patient ID: Jane Duncan, female    DOB: [birth year], 52 y.o.   MRN: [id number]  Chief Complaint  Patient presents with   Cough   Hot Flashes    HPI: Patient is in today for hot flashes.  Approximately 2 to 3 weeks ago she started having hot flashes and irritability.  She was concerned she was going into menopause.  She had an abdominal hysterectomy 15+ years ago.  Oddly enough her symptoms improved in the last week.  Since Friday she has had sinus congestion and coughing.  No fevers, chills.  Fullness in her ears.  Denies chest pain or shortness of breath.  Past Medical History:  Diagnosis Date   COVID-19 08/15/2021   Kidney stone     Past Surgical History:  Procedure Laterality Date   ABDOMINAL HYSTERECTOMY     Unsure if abdominal or vaginal or if total or partial.    CESAREAN SECTION     X #    Family History  Problem Relation Age of Onset   CAD Other    Hypertension Other    CAD Father    Hypertension Brother     Social History   Socioeconomic History   Marital status: Married    Spouse name: Not on file   Number of children: Not on file   Years of education: Not on file   Highest education level: Not on file  Occupational History   Occupation: home maker  Tobacco Use   Smoking status: Never   Smokeless tobacco: Never  Substance and Sexual Activity   Alcohol use: Yes    Alcohol/week: 14.0 standard drinks of alcohol    Types: 14 Glasses of wine per week    Comment: 1-2 glasses of wine per night.   Drug use: Never   Sexual activity: Yes  Other Topics Concern   Not on file  Social History Narrative   Not on file   Social Determinants of Health   Financial Resource Strain: Not on file  Food Insecurity: Not on file  Transportation Needs: Not on file  Physical Activity: Not on file  Stress: Not on file  Social Connections: Not on file  Intimate Partner Violence: Not on file    Outpatient Medications Prior  to Visit  Medication Sig Dispense Refill   predniSONE (DELTASONE) 50 MG tablet Take 1 tablet (50 mg total) by mouth daily with breakfast. 5 tablet 0   No facility-administered medications prior to visit.    Allergies  Allergen Reactions   Ciprofloxacin     MALAISE   Erythromycin Base Rash    Review of Systems     Objective:        09/27/2022    3:59 PM 09/19/2022   12:13 PM 09/13/2022    8:52 AM  Vitals with BMI  Height '5\' 3"'$  '5\' 3"'$  '5\' 3"'$   Weight 147 lbs 146 lbs 147 lbs  BMI 26.05 [id number]  Systolic 123456 0000000 123456  Diastolic 72 88 84  Pulse 60 79 67    No data found.   Physical Exam  Health Maintenance Due  Topic Date Due   HIV Screening  Never done   Hepatitis C Screening  Never done   DTaP/Tdap/Td (1 - Tdap) Never done   COLONOSCOPY (Pts 45-72yr Insurance coverage will need to be confirmed)  Never done   MAMMOGRAM  Never done   Zoster Vaccines- Shingrix (2 of 2) 09/29/2021  COVID-19 Vaccine (3 - 2023-24 season) 07/14/2022    There are no preventive care reminders to display for this patient.   Lab Results  Component Value Date   TSH 1.950 02/10/2022   Lab Results  Component Value Date   WBC 6.9 02/10/2022   HGB 14.0 02/10/2022   HCT 42.3 02/10/2022   MCV 89 02/10/2022   PLT 303 02/10/2022   Lab Results  Component Value Date   NA 139 02/10/2022   K 4.6 02/10/2022   CO2 21 02/10/2022   GLUCOSE 90 02/10/2022   BUN 10 02/10/2022   CREATININE 0.75 02/10/2022   BILITOT 0.3 02/10/2022   ALKPHOS 69 02/10/2022   AST 14 02/10/2022   ALT 13 02/10/2022   PROT 6.8 02/10/2022   ALBUMIN 4.6 02/10/2022   CALCIUM 9.2 02/10/2022   EGFR 96 02/10/2022   Lab Results  Component Value Date   CHOL 214 (H) 08/04/2021   Lab Results  Component Value Date   HDL 104 08/04/2021   Lab Results  Component Value Date   LDLCALC 101 (H) 08/04/2021   Lab Results  Component Value Date   TRIG 49 08/04/2021   Lab Results  Component Value Date   CHOLHDL 2.1  08/04/2021   No results found for: "HGBA1C"     Assessment & Plan:  COVID-19 virus infection Rx: molnupiravir sent. Recommend rest, fluids, 3 meals per day.  Fever reducing medicines.  OTC cold and congestion medicines  Acute cough Check rapid covid test.    Meds ordered this encounter  Medications   DISCONTD: molnupiravir EUA (LAGEVRIO) 200 mg CAPS capsule    Sig: Take 4 capsules (800 mg total) by mouth 2 (two) times daily for 5 days.    Dispense:  40 capsule    Refill:  0   DISCONTD: molnupiravir EUA (LAGEVRIO) 200 mg CAPS capsule    Sig: Take 4 capsules (800 mg total) by mouth 2 (two) times daily for 5 days.    Dispense:  40 capsule    Refill:  0    Orders Placed This Encounter  Procedures   POC COVID-19 BinaxNow     Follow-up: No follow-ups on file.  An After Visit Summary was printed and given to the patient.   Geralynn Ochs I Leal-Borjas,acting as a scribe for Rochel Brome, MD.,have documented all relevant documentation on the behalf of Rochel Brome, MD,as directed by  Rochel Brome, MD while in the presence of Rochel Brome, MD.    Rochel Brome, MD [place] [phone]

## 2023-01-02 ENCOUNTER — Other Ambulatory Visit: Payer: Self-pay

## 2023-01-02 DIAGNOSIS — Z1231 Encounter for screening mammogram for malignant neoplasm of breast: Secondary | ICD-10-CM

## 2023-01-03 ENCOUNTER — Other Ambulatory Visit: Payer: Self-pay | Admitting: Family Medicine

## 2023-01-03 ENCOUNTER — Telehealth: Payer: Self-pay

## 2023-01-03 MED ORDER — ESZOPICLONE 3 MG PO TABS: 3.0000 mg | ORAL_TABLET | Freq: Every day | ORAL | 1 refills | Status: DC

## 2023-01-03 MED ORDER — HYDROCODONE BIT-HOMATROP MBR 5-1.5 MG/5ML PO SOLN: 5.0000 mL | Freq: Four times a day (QID) | ORAL | 0 refills | Status: DC | PRN

## 2023-01-03 NOTE — Telephone Encounter (Signed)
Patient Made Aware, Verbalized Understanding.

## 2023-01-03 NOTE — Telephone Encounter (Signed)
Patient called and stated she forgot to mention to you that she was having trouble sleeping and wanted to know if you could send her something in, also she wanted to know if you could send in something for her cough, stated last time you sent her in Hydromet. Please advise. [facility]

## 2023-01-04 ENCOUNTER — Other Ambulatory Visit: Payer: Self-pay | Admitting: Family Medicine

## 2023-01-04 ENCOUNTER — Other Ambulatory Visit: Payer: Self-pay

## 2023-01-04 MED ORDER — HYDROCOD POLI-CHLORPHE POLI ER 10-8 MG/5ML PO SUER: 5.0000 mL | Freq: Two times a day (BID) | ORAL | 0 refills | Status: DC | PRN

## 2023-01-04 NOTE — Telephone Encounter (Signed)
Patient informed. 

## 2023-01-04 NOTE — Telephone Encounter (Signed)
Patient called and stated that all pharmacy does not have Hydromet  in stock and wanted to know if you could call in something else for cough. Please advise

## 2023-01-05 NOTE — Assessment & Plan Note (Signed)
Check rapid covid test.

## 2023-01-05 NOTE — Assessment & Plan Note (Signed)
Rx: molnupiravir sent. Recommend rest, fluids, 3 meals per day.  Fever reducing medicines.  OTC cold and congestion medicines

## 2023-01-06 ENCOUNTER — Encounter: Payer: Self-pay | Admitting: Family Medicine

## 2023-01-06 DIAGNOSIS — R5383 Other fatigue: Secondary | ICD-10-CM | POA: Insufficient documentation

## 2023-01-06 DIAGNOSIS — Z13220 Encounter for screening for lipoid disorders: Secondary | ICD-10-CM | POA: Insufficient documentation

## 2023-01-06 DIAGNOSIS — N959 Unspecified menopausal and perimenopausal disorder: Secondary | ICD-10-CM | POA: Insufficient documentation

## 2023-01-06 DIAGNOSIS — Z780 Asymptomatic menopausal state: Secondary | ICD-10-CM | POA: Insufficient documentation

## 2023-01-06 NOTE — Assessment & Plan Note (Signed)
Return for fasting labs.

## 2023-01-06 NOTE — Assessment & Plan Note (Signed)
>>  ASSESSMENT AND PLAN FOR MENOPAUSAL DISORDER WRITTEN ON 01/06/2023 10:43 PM BY COX, KIRSTEN, MD  Return for labs.  Consider veozah vs hrt. Ironically has improved in the last week

## 2023-01-06 NOTE — Assessment & Plan Note (Signed)
Return for labs.

## 2023-01-06 NOTE — Assessment & Plan Note (Addendum)
Return for labs.  Consider veozah vs hrt. Ironically has improved in the last week

## 2023-01-17 ENCOUNTER — Inpatient Hospital Stay: Admission: RE | Admit: 2023-01-17 | Payer: BC Managed Care – PPO | Source: Ambulatory Visit

## 2023-05-24 ENCOUNTER — Ambulatory Visit: Payer: BC Managed Care – PPO | Admitting: Family Medicine

## 2023-05-24 ENCOUNTER — Encounter: Payer: Self-pay | Admitting: Family Medicine

## 2023-05-24 ENCOUNTER — Telehealth: Payer: Self-pay

## 2023-05-24 VITALS — BP 110/64 | HR 78 | Temp 97.1°F | Resp 16 | Ht 63.0 in | Wt 144.0 lb

## 2023-05-24 DIAGNOSIS — R110 Nausea: Secondary | ICD-10-CM

## 2023-05-24 DIAGNOSIS — R197 Diarrhea, unspecified: Secondary | ICD-10-CM

## 2023-05-24 DIAGNOSIS — B86 Scabies: Secondary | ICD-10-CM

## 2023-05-24 MED ORDER — PERMETHRIN 5 % EX CREA: TOPICAL_CREAM | CUTANEOUS | 1 refills | Status: DC

## 2023-05-24 NOTE — Telephone Encounter (Signed)
Patient called wanting to schedule an appointment for this week to see you, and informed patient there was no openings and patient states she wanted [place] to send a message to you asking if you can see her. She states she has been having diarrhea constantly over the past week and half and states that she was having cramps but they have gotten better now. She said she can't eat with out going to the bathroom right after and is concerned. Please advise.

## 2023-05-24 NOTE — Telephone Encounter (Signed)
Patient informed and scheduled.

## 2023-05-24 NOTE — Progress Notes (Unsigned)
Acute Office Visit  Subjective:    Patient ID: Jane Duncan, female    DOB: 02/10/1971, 52 y.o.   MRN: [id number]  Chief Complaint  Patient presents with   Diarrhea   Nausea   Rash    HPI: Patient is in today for abdominal cramping and diarrhea, which has been going on since June 26.  Diarrhea improved but still occurs after meals. She also is experiencing itching of her back, chest and left shoulder.  Her rash started after she spent the night out of town with her daughter.  Past Medical History:  Diagnosis Date   COVID-19 08/15/2021   Kidney stone     Past Surgical History:  Procedure Laterality Date   ABDOMINAL HYSTERECTOMY     Unsure if abdominal or vaginal or if total or partial.    CESAREAN SECTION     X #    Family History  Problem Relation Age of Onset   CAD Other    Hypertension Other    CAD Father    Hypertension Brother     Social History   Socioeconomic History   Marital status: Married    Spouse name: Not on file   Number of children: Not on file   Years of education: Not on file   Highest education level: Not on file  Occupational History   Occupation: home maker  Tobacco Use   Smoking status: Never   Smokeless tobacco: Never  Substance and Sexual Activity   Alcohol use: Yes    Alcohol/week: 14.0 standard drinks of alcohol    Types: 14 Glasses of wine per week    Comment: 1-2 glasses of wine per night.   Drug use: Never   Sexual activity: Yes  Other Topics Concern   Not on file  Social History Narrative   Not on file   Social Determinants of Health   Financial Resource Strain: Not on file  Food Insecurity: Not on file  Transportation Needs: Not on file  Physical Activity: Not on file  Stress: Not on file  Social Connections: Not on file  Intimate Partner Violence: Not on file    Outpatient Medications Prior to Visit  Medication Sig Dispense Refill   chlorpheniramine-HYDROcodone (TUSSIONEX) 10-8 MG/5ML Take 5 mLs by mouth  every 12 (twelve) hours as needed for cough. 115 mL 0   eszopiclone 3 MG TABS Take 1 tablet (3 mg total) by mouth at bedtime. Take immediately before bedtime 30 tablet 1   No facility-administered medications prior to visit.    Allergies  Allergen Reactions   Ciprofloxacin     MALAISE   Erythromycin Base Rash    Review of Systems  Constitutional:  Negative for chills and fever.  Gastrointestinal:  Positive for abdominal pain, diarrhea and nausea.  Skin:  Positive for rash.  Neurological:  Positive for light-headedness and headaches.  Psychiatric/Behavioral:  Negative for dysphoric mood. The patient is not nervous/anxious.        Objective:        05/24/2023    1:50 PM 09/27/2022    3:59 PM 09/19/2022   12:13 PM  Vitals with BMI  Height 5\' 3"  5\' 3"  5\' 3"   Weight 144 lbs 147 lbs 146 lbs  BMI 25.51 26.05 25.87  Systolic 110 120 284  Diastolic 64 72 88  Pulse 78 60 79    No data found.   Physical Exam Vitals reviewed.  Constitutional:      Appearance: Normal appearance. She is  normal weight.  Neck:     Vascular: No carotid bruit.  Cardiovascular:     Rate and Rhythm: Normal rate and regular rhythm.     Heart sounds: Normal heart sounds.  Pulmonary:     Effort: Pulmonary effort is normal. No respiratory distress.     Breath sounds: Normal breath sounds.  Abdominal:     General: Abdomen is flat. Bowel sounds are normal.     Palpations: Abdomen is soft.     Tenderness: There is abdominal tenderness (RLQ. mild.).  Skin:    Findings: Rash (scabs on back) present.  Neurological:     Mental Status: She is alert and oriented to person, place, and time.  Psychiatric:        Mood and Affect: Mood normal.        Behavior: Behavior normal.     Health Maintenance Due  Topic Date Due   HIV Screening  Never done   Hepatitis C Screening  Never done   DTaP/Tdap/Td (1 - Tdap) Never done   Colonoscopy  Never done   MAMMOGRAM  Never done   Zoster Vaccines- Shingrix (2  of 2) 09/29/2021   COVID-19 Vaccine (3 - 2023-24 season) 07/14/2022    There are no preventive care reminders to display for this patient.   Lab Results  Component Value Date   TSH 1.950 02/10/2022   Lab Results  Component Value Date   WBC 6.9 02/10/2022   HGB 14.0 02/10/2022   HCT 42.3 02/10/2022   MCV 89 02/10/2022   PLT 303 02/10/2022   Lab Results  Component Value Date   NA 139 02/10/2022   K 4.6 02/10/2022   CO2 21 02/10/2022   GLUCOSE 90 02/10/2022   BUN 10 02/10/2022   CREATININE 0.75 02/10/2022   BILITOT 0.3 02/10/2022   ALKPHOS 69 02/10/2022   AST 14 02/10/2022   ALT 13 02/10/2022   PROT 6.8 02/10/2022   ALBUMIN 4.6 02/10/2022   CALCIUM 9.2 02/10/2022   EGFR 96 02/10/2022   Lab Results  Component Value Date   CHOL 214 (H) 08/04/2021   Lab Results  Component Value Date   HDL 104 08/04/2021   Lab Results  Component Value Date   LDLCALC 101 (H) 08/04/2021   Lab Results  Component Value Date   TRIG 49 08/04/2021   Lab Results  Component Value Date   CHOLHDL 2.1 08/04/2021   No results found for: "HGBA1C"     Assessment & Plan:  Scabies Assessment & Plan: Sent Permethrin Education given.  Orders: -     Permethrin; Apply cream from neck to feet, leave on for 8 to 14 hours, then wash with soap/water, repeat application if itching returns.  Dispense: 60 g; Refill: 1  Diarrhea, unspecified type Assessment & Plan: Order for stool studies. Probiotics daily.  Restora one daily. Imodium OTC for diarrhea if needed. Push fluids.    Orders: -     Cdiff NAA+O+P+Stool Culture  Nausea Assessment & Plan: Push Fluids      Meds ordered this encounter  Medications   permethrin (ELIMITE) 5 % cream    Sig: Apply cream from neck to feet, leave on for 8 to 14 hours, then wash with soap/water, repeat application if itching returns.    Dispense:  60 g    Refill:  1    Orders Placed This Encounter  Procedures   Cdiff NAA+O+P+Stool Culture      Follow-up: Return if symptoms worsen or fail  to improve.  An After Visit Summary was printed and given to the patient.   Clayborn Bigness I Leal-Borjas,acting as a scribe for Blane Ohara, MD.,have documented all relevant documentation on the behalf of Blane Ohara, MD,as directed by  Blane Ohara, MD while in the presence of Blane Ohara, MD.    Blane Ohara, MD [facility] [phone]

## 2023-05-24 NOTE — Patient Instructions (Addendum)
Order for stool studies. Probiotics daily.  Restora one daily. Imodium OTC for diarrhea if needed. Push fluids.

## 2023-05-25 DIAGNOSIS — R197 Diarrhea, unspecified: Secondary | ICD-10-CM | POA: Insufficient documentation

## 2023-05-25 DIAGNOSIS — R110 Nausea: Secondary | ICD-10-CM | POA: Insufficient documentation

## 2023-05-25 DIAGNOSIS — B86 Scabies: Secondary | ICD-10-CM | POA: Insufficient documentation

## 2023-05-25 NOTE — Assessment & Plan Note (Signed)
Push Fluids

## 2023-05-25 NOTE — Assessment & Plan Note (Signed)
Sent Permethrin Education given.

## 2023-05-25 NOTE — Assessment & Plan Note (Signed)
Order for stool studies. Probiotics daily.  Restora one daily. Imodium OTC for diarrhea if needed. Push fluids.   

## 2023-05-29 ENCOUNTER — Ambulatory Visit: Payer: BC Managed Care – PPO | Admitting: Family Medicine

## 2023-05-29 ENCOUNTER — Telehealth: Payer: Self-pay

## 2023-05-29 VITALS — BP 106/64 | HR 84 | Temp 97.2°F | Resp 16 | Ht 63.0 in | Wt 142.4 lb

## 2023-05-29 DIAGNOSIS — A029 Salmonella infection, unspecified: Secondary | ICD-10-CM

## 2023-05-29 DIAGNOSIS — R519 Headache, unspecified: Secondary | ICD-10-CM

## 2023-05-29 DIAGNOSIS — R509 Fever, unspecified: Secondary | ICD-10-CM | POA: Diagnosis not present

## 2023-05-29 LAB — CDIFF NAA+O+P+STOOL CULTURE
E coli, Shiga toxin Assay: NEGATIVE
Toxigenic C. Difficile by PCR: NEGATIVE

## 2023-05-29 LAB — POCT INFLUENZA A/B
Influenza A, POC: NEGATIVE
Influenza B, POC: NEGATIVE

## 2023-05-29 LAB — POC COVID19 BINAXNOW: SARS Coronavirus 2 Ag: NEGATIVE

## 2023-05-29 MED ORDER — CEFTRIAXONE SODIUM 1 G IJ SOLR
1.0000 g | Freq: Once | INTRAMUSCULAR | Status: AC
Start: 2023-05-29 — End: 2023-05-29
  Administered 2023-05-29: 1 g via INTRAMUSCULAR

## 2023-05-29 MED ORDER — DOXYCYCLINE HYCLATE 100 MG PO TABS: 100.0000 mg | ORAL_TABLET | Freq: Two times a day (BID) | ORAL | 0 refills | Status: DC

## 2023-05-29 MED ORDER — SULFAMETHOXAZOLE-TRIMETHOPRIM 800-160 MG PO TABS: 1.0000 | ORAL_TABLET | Freq: Two times a day (BID) | ORAL | 0 refills | Status: DC

## 2023-05-29 NOTE — Telephone Encounter (Signed)
 Patient aware.

## 2023-05-29 NOTE — Telephone Encounter (Signed)
Patient called stating that she has noticed since Sunday that she has felt cold and hot on and off and yesterday had a migraine and she states that she took her Temperature and it was 102. She states that She no longer has any stomach issues or anything just she noticed she had the migraine yesterday. Please advise.

## 2023-05-29 NOTE — Progress Notes (Signed)
Acute Office Visit  Subjective:    Patient ID: Jane Duncan, female    DOB: 1970-12-03, 52 y.o.   MRN: [id number]  Chief Complaint  Patient presents with   Fever   Chills    HPI: Patient is in today for chills and fever which started on Sunday.  She denies sore throat, cough, or chest congestion.  No dysuria or urinary frequency.  She has been experiencing pain in her right leg but no swelling noted.  She had a severe headache last pm and her temperature was 102.  She has been eating very little, but has been pushing fluids and taking aleve for her fever.    Past Medical History:  Diagnosis Date   COVID-19 08/15/2021   Kidney stone     Past Surgical History:  Procedure Laterality Date   ABDOMINAL HYSTERECTOMY     Unsure if abdominal or vaginal or if total or partial.    CESAREAN SECTION     X #    Family History  Problem Relation Age of Onset   CAD Other    Hypertension Other    CAD Father    Hypertension Brother     Social History   Socioeconomic History   Marital status: Married    Spouse name: Not on file   Number of children: Not on file   Years of education: Not on file   Highest education level: Not on file  Occupational History   Occupation: home maker  Tobacco Use   Smoking status: Never   Smokeless tobacco: Never  Substance and Sexual Activity   Alcohol use: Yes    Alcohol/week: 14.0 standard drinks of alcohol    Types: 14 Glasses of wine per week    Comment: 1-2 glasses of wine per night.   Drug use: Never   Sexual activity: Yes  Other Topics Concern   Not on file  Social History Narrative   Not on file   Social Determinants of Health   Financial Resource Strain: Not on file  Food Insecurity: Not on file  Transportation Needs: Not on file  Physical Activity: Not on file  Stress: Not on file  Social Connections: Not on file  Intimate Partner Violence: Not on file    Outpatient Medications Prior to Visit  Medication Sig Dispense  Refill   permethrin (ELIMITE) 5 % cream Apply cream from neck to feet, leave on for 8 to 14 hours, then wash with soap/water, repeat application if itching returns. 60 g 1   No facility-administered medications prior to visit.    Allergies  Allergen Reactions   Ciprofloxacin     MALAISE   Erythromycin Base Rash    Review of Systems  Constitutional:  Positive for chills and fever.  HENT:  Negative for congestion.   Respiratory:  Negative for cough.   Cardiovascular:  Negative for chest pain.  Gastrointestinal:  Negative for abdominal pain.       Objective:        05/29/2023    2:21 PM 05/24/2023    1:50 PM 09/27/2022    3:59 PM  Vitals with BMI  Height 5\' 3"  5\' 3"  5\' 3"   Weight 142 lbs 6 oz 144 lbs 147 lbs  BMI 25.23 25.51 26.05  Systolic 106 110 010  Diastolic 64 64 72  Pulse 84 78 60    No data found.   Physical Exam Vitals reviewed.  Constitutional:      Appearance: She is ill-appearing.  HENT:     Right Ear: Tympanic membrane normal.     Left Ear: Tympanic membrane normal.     Nose: Congestion present.     Mouth/Throat:     Pharynx: No oropharyngeal exudate or posterior oropharyngeal erythema.  Cardiovascular:     Rate and Rhythm: Normal rate and regular rhythm.     Heart sounds: Normal heart sounds.  Pulmonary:     Effort: Pulmonary effort is normal.     Breath sounds: Normal breath sounds.  Abdominal:     General: Bowel sounds are normal.     Palpations: Abdomen is soft.     Tenderness: There is no abdominal tenderness. There is no guarding or rebound.  Lymphadenopathy:     Cervical: No cervical adenopathy.  Neurological:     Mental Status: She is alert and oriented to person, place, and time.  Psychiatric:        Mood and Affect: Mood normal.        Behavior: Behavior normal.     Health Maintenance Due  Topic Date Due   HIV Screening  Never done   Hepatitis C Screening  Never done   DTaP/Tdap/Td (1 - Tdap) Never done   Colonoscopy  Never  done   MAMMOGRAM  Never done   Zoster Vaccines- Shingrix (2 of 2) 09/29/2021   COVID-19 Vaccine (3 - 2023-24 season) 07/14/2022    There are no preventive care reminders to display for this patient.   Lab Results  Component Value Date   TSH 1.950 02/10/2022   Lab Results  Component Value Date   WBC 6.9 02/10/2022   HGB 14.0 02/10/2022   HCT 42.3 02/10/2022   MCV 89 02/10/2022   PLT 303 02/10/2022   Lab Results  Component Value Date   NA 139 02/10/2022   K 4.6 02/10/2022   CO2 21 02/10/2022   GLUCOSE 90 02/10/2022   BUN 10 02/10/2022   CREATININE 0.75 02/10/2022   BILITOT 0.3 02/10/2022   ALKPHOS 69 02/10/2022   AST 14 02/10/2022   ALT 13 02/10/2022   PROT 6.8 02/10/2022   ALBUMIN 4.6 02/10/2022   CALCIUM 9.2 02/10/2022   EGFR 96 02/10/2022   Lab Results  Component Value Date   CHOL 214 (H) 08/04/2021   Lab Results  Component Value Date   HDL 104 08/04/2021   Lab Results  Component Value Date   LDLCALC 101 (H) 08/04/2021   Lab Results  Component Value Date   TRIG 49 08/04/2021   Lab Results  Component Value Date   CHOLHDL 2.1 08/04/2021   No results found for: "HGBA1C"     Assessment & Plan:  Salmonella Assessment & Plan: Sent Bactrim. Check labs.  Orders: -     Sulfamethoxazole-Trimethoprim; Take 1 tablet by mouth 2 (two) times daily.  Dispense: 14 tablet; Refill: 0  Fever, unspecified fever cause Assessment & Plan: Order Rocephin 1g IM #1 at the office. Check labs. Order rapid covid test and flu test. Sent antibiotics.  Orders: -     POCT Influenza A/B -     POC COVID-19 BinaxNow -     CBC with Differential/Platelet -     Comprehensive metabolic panel -     Spotted Fever Group Antibodies -     Doxycycline Hyclate; Take 1 tablet (100 mg total) by mouth 2 (two) times daily.  Dispense: 20 tablet; Refill: 0 -     Sulfamethoxazole-Trimethoprim; Take 1 tablet by mouth 2 (two) times daily.  Dispense: 14 tablet; Refill: 0 -     cefTRIAXone  Sodium  Acute intractable headache, unspecified headache type Assessment & Plan: Check labs. Sent doxycycline and Bactrim.  Orders: -     Spotted Fever Group Antibodies -     Doxycycline Hyclate; Take 1 tablet (100 mg total) by mouth 2 (two) times daily.  Dispense: 20 tablet; Refill: 0 -     Sulfamethoxazole-Trimethoprim; Take 1 tablet by mouth 2 (two) times daily.  Dispense: 14 tablet; Refill: 0 -     cefTRIAXone Sodium     Meds ordered this encounter  Medications   doxycycline (VIBRA-TABS) 100 MG tablet    Sig: Take 1 tablet (100 mg total) by mouth 2 (two) times daily.    Dispense:  20 tablet    Refill:  0   sulfamethoxazole-trimethoprim (BACTRIM DS) 800-160 MG tablet    Sig: Take 1 tablet by mouth 2 (two) times daily.    Dispense:  14 tablet    Refill:  0   cefTRIAXone (ROCEPHIN) injection 1 g    Orders Placed This Encounter  Procedures   CBC with Differential/Platelet   Comprehensive metabolic panel   Spotted Fever Group Antibodies   POCT Influenza A/B   POC COVID-19 BinaxNow     Follow-up: No follow-ups on file.  An After Visit Summary was printed and given to the patient.   Clayborn Bigness I Leal-Borjas,acting as a scribe for Blane Ohara, MD.,have documented all relevant documentation on the behalf of Blane Ohara, MD,as directed by  Blane Ohara, MD while in the presence of Blane Ohara, MD.   Blane Ohara, MD [facility] [phone]

## 2023-05-31 ENCOUNTER — Telehealth: Payer: Self-pay

## 2023-05-31 NOTE — Telephone Encounter (Signed)
Infection Control Nurse from [facility] called about patient testing positive for Salmonella, wanted our office to send over notes for visit. Stated results was sent to them from [facility].  Patient made aware that infection control would be given her a call and made aware that office note was faxed to [facility] infection control office.

## 2023-06-02 ENCOUNTER — Encounter: Payer: Self-pay | Admitting: Family Medicine

## 2023-06-02 DIAGNOSIS — R519 Headache, unspecified: Secondary | ICD-10-CM | POA: Insufficient documentation

## 2023-06-02 DIAGNOSIS — A029 Salmonella infection, unspecified: Secondary | ICD-10-CM | POA: Insufficient documentation

## 2023-06-02 DIAGNOSIS — R509 Fever, unspecified: Secondary | ICD-10-CM | POA: Insufficient documentation

## 2023-06-02 NOTE — Assessment & Plan Note (Signed)
Sent Bactrim. Check labs.

## 2023-06-02 NOTE — Assessment & Plan Note (Addendum)
Order Rocephin 1g IM #1 at the office. Check labs. Order rapid covid test and flu test. Sent antibiotics.

## 2023-06-02 NOTE — Assessment & Plan Note (Signed)
Check labs. Sent doxycycline and Bactrim.

## 2023-06-14 ENCOUNTER — Telehealth: Payer: Self-pay | Admitting: Family Medicine

## 2023-06-14 NOTE — Telephone Encounter (Signed)
Pt called today to request a same day appointment for the following symptoms:possinle uti. No openings today and patient doesn't want to wait to tomorrow for an appt. Unfortunately, our schedule is full and we have no openings between today or tomorrow. Pt was notified that they can wait on a call from our triage or they can do an e-visit through MyChart with a  Provider from home if they have the ability .

## 2023-06-18 ENCOUNTER — Encounter: Payer: Self-pay | Admitting: Family Medicine

## 2023-06-18 ENCOUNTER — Ambulatory Visit: Payer: BC Managed Care – PPO | Admitting: Family Medicine

## 2023-06-18 VITALS — BP 110/72 | HR 72 | Temp 97.3°F | Resp 15 | Ht 63.0 in | Wt 141.6 lb

## 2023-06-18 DIAGNOSIS — N3001 Acute cystitis with hematuria: Secondary | ICD-10-CM

## 2023-06-18 LAB — POCT URINALYSIS DIP (CLINITEK)
Bilirubin, UA: NEGATIVE
Glucose, UA: NEGATIVE mg/dL
Ketones, POC UA: NEGATIVE mg/dL
Nitrite, UA: NEGATIVE
POC PROTEIN,UA: NEGATIVE
Spec Grav, UA: 1.01 (ref 1.010–1.025)
Urobilinogen, UA: 0.2 E.U./dL
pH, UA: 7 (ref 5.0–8.0)

## 2023-06-18 MED ORDER — FLUCONAZOLE 150 MG PO TABS: 150.0000 mg | ORAL_TABLET | Freq: Every day | ORAL | 0 refills | Status: DC

## 2023-06-18 MED ORDER — CEFTRIAXONE SODIUM 1 G IJ SOLR
1.0000 g | Freq: Once | INTRAMUSCULAR | Status: AC
Start: 2023-06-18 — End: 2023-06-18
  Administered 2023-06-18: 1 g via INTRAMUSCULAR

## 2023-06-18 MED ORDER — DOXYCYCLINE HYCLATE 100 MG PO TABS: 100.0000 mg | ORAL_TABLET | Freq: Two times a day (BID) | ORAL | 0 refills | Status: DC

## 2023-06-18 NOTE — Addendum Note (Signed)
Addended byBlane Ohara on: 06/18/2023 02:28 PM   Modules accepted: Orders

## 2023-06-18 NOTE — Patient Instructions (Signed)
Start doxycyclline. Rocephin shot given.  Push fluids.  Complete bactrim (current antibiotics.)

## 2023-06-18 NOTE — Progress Notes (Signed)
Acute Office Visit  Subjective:    Patient ID: Jane Duncan, female    DOB: May 09, 1971, 52 y.o.   MRN: [id number]  Chief Complaint  Patient presents with   Urinary Tract Infection    HPI: Patient is in today for urinary frequency and burning.  Patient was seen at the urgent care 2 days ago.  Patient was given Bactrim DS.  Urine culture is preliminary with 10-40,000 CFU's.  Patient also had some flank pain on Saturday however this seems to have resolved.  Patient does have a history of kidney stones.  Past Medical History:  Diagnosis Date   COVID-19 08/15/2021   Kidney stone     Past Surgical History:  Procedure Laterality Date   ABDOMINAL HYSTERECTOMY     Unsure if abdominal or vaginal or if total or partial.    CESAREAN SECTION     X #    Family History  Problem Relation Age of Onset   CAD Other    Hypertension Other    CAD Father    Hypertension Brother     Social History   Socioeconomic History   Marital status: Married    Spouse name: Not on file   Number of children: Not on file   Years of education: Not on file   Highest education level: Not on file  Occupational History   Occupation: home maker  Tobacco Use   Smoking status: Never   Smokeless tobacco: Never  Substance and Sexual Activity   Alcohol use: Yes    Alcohol/week: 14.0 standard drinks of alcohol    Types: 14 Glasses of wine per week    Comment: 1-2 glasses of wine per night.   Drug use: Never   Sexual activity: Yes  Other Topics Concern   Not on file  Social History Narrative   Not on file   Social Determinants of Health   Financial Resource Strain: Not on file  Food Insecurity: Not on file  Transportation Needs: Not on file  Physical Activity: Not on file  Stress: Not on file  Social Connections: Not on file  Intimate Partner Violence: Not on file    Outpatient Medications Prior to Visit  Medication Sig Dispense Refill   sulfamethoxazole-trimethoprim (BACTRIM DS) 800-160  MG tablet Take 1 tablet by mouth 2 (two) times daily.     doxycycline (VIBRA-TABS) 100 MG tablet Take 1 tablet (100 mg total) by mouth 2 (two) times daily. 20 tablet 0   permethrin (ELIMITE) 5 % cream Apply cream from neck to feet, leave on for 8 to 14 hours, then wash with soap/water, repeat application if itching returns. 60 g 1   sulfamethoxazole-trimethoprim (BACTRIM DS) 800-160 MG tablet Take 1 tablet by mouth 2 (two) times daily. 14 tablet 0   No facility-administered medications prior to visit.    Allergies  Allergen Reactions   Ciprofloxacin     MALAISE   Erythromycin Base Rash    Review of Systems  Constitutional:  Negative for chills, fatigue and fever.  HENT:  Negative for congestion, rhinorrhea and sore throat.   Respiratory:  Negative for cough and shortness of breath.   Cardiovascular:  Negative for chest pain.  Gastrointestinal:  Negative for abdominal pain, constipation, diarrhea, nausea and vomiting.  Genitourinary:  Positive for dysuria, frequency, hematuria and urgency.  Musculoskeletal:  Positive for back pain (stabbing left flank pain on Saturday.). Negative for myalgias.  Neurological:  Negative for dizziness, weakness, light-headedness and headaches.  Psychiatric/Behavioral:  Negative  for dysphoric mood. The patient is not nervous/anxious.        Objective:        06/18/2023    1:59 PM 05/29/2023    2:21 PM 05/24/2023    1:50 PM  Vitals with BMI  Height 5\' 3"  5\' 3"  5\' 3"   Weight 141 lbs 10 oz 142 lbs 6 oz 144 lbs  BMI 25.09 25.23 25.51  Systolic 110 106 846  Diastolic 72 64 64  Pulse 72 84 78    No data found.   Physical Exam Vitals reviewed.  Constitutional:      Appearance: Normal appearance. She is normal weight.  Cardiovascular:     Rate and Rhythm: Normal rate and regular rhythm.     Heart sounds: Normal heart sounds.  Pulmonary:     Effort: Pulmonary effort is normal. No respiratory distress.     Breath sounds: Normal breath sounds.   Abdominal:     General: Abdomen is flat. Bowel sounds are normal.     Palpations: Abdomen is soft.     Tenderness: There is no abdominal tenderness. There is no right CVA tenderness or left CVA tenderness.  Neurological:     Mental Status: She is alert and oriented to person, place, and time.  Psychiatric:        Mood and Affect: Mood normal.        Behavior: Behavior normal.     Health Maintenance Due  Topic Date Due   HIV Screening  Never done   Hepatitis C Screening  Never done   DTaP/Tdap/Td (1 - Tdap) Never done   Colonoscopy  Never done   MAMMOGRAM  Never done   Zoster Vaccines- Shingrix (2 of 2) 09/29/2021   COVID-19 Vaccine (3 - 2023-24 season) 07/14/2022   INFLUENZA VACCINE  06/14/2023    There are no preventive care reminders to display for this patient.   Lab Results  Component Value Date   TSH 1.950 02/10/2022   Lab Results  Component Value Date   WBC 5.5 05/29/2023   HGB 14.1 05/29/2023   HCT 42.1 05/29/2023   MCV 89 05/29/2023   PLT 233 05/29/2023   Lab Results  Component Value Date   NA 138 05/29/2023   K 5.0 05/29/2023   CO2 22 05/29/2023   GLUCOSE 110 (H) 05/29/2023   BUN 14 05/29/2023   CREATININE 0.63 05/29/2023   BILITOT 0.2 05/29/2023   ALKPHOS 76 05/29/2023   AST 34 05/29/2023   ALT 35 (H) 05/29/2023   PROT 6.8 05/29/2023   ALBUMIN 4.4 05/29/2023   CALCIUM 9.1 05/29/2023   EGFR 107 05/29/2023   Lab Results  Component Value Date   CHOL 214 (H) 08/04/2021   Lab Results  Component Value Date   HDL 104 08/04/2021   Lab Results  Component Value Date   LDLCALC 101 (H) 08/04/2021   Lab Results  Component Value Date   TRIG 49 08/04/2021   Lab Results  Component Value Date   CHOLHDL 2.1 08/04/2021   No results found for: "HGBA1C"     Assessment & Plan:  Acute cystitis with hematuria -     Urine Culture -     POCT URINALYSIS DIP (CLINITEK) -     Doxycycline Hyclate; Take 1 tablet (100 mg total) by mouth 2 (two) times  daily.  Dispense: 14 tablet; Refill: 0 -     cefTRIAXone Sodium     Meds ordered this encounter  Medications   doxycycline (  VIBRA-TABS) 100 MG tablet    Sig: Take 1 tablet (100 mg total) by mouth 2 (two) times daily.    Dispense:  14 tablet    Refill:  0   cefTRIAXone (ROCEPHIN) injection 1 g    Orders Placed This Encounter  Procedures   Urine Culture   POCT URINALYSIS DIP (CLINITEK)     Follow-up: No follow-ups on file.  An After Visit Summary was printed and given to the patient.  Blane Ohara, MD [facility] [phone]

## 2023-06-20 ENCOUNTER — Telehealth: Payer: Self-pay

## 2023-06-20 NOTE — Telephone Encounter (Signed)
Jane Duncan called to report that she received a call from the Urgent Care in [facility] and the medication that they gave was resistant.  She also informed them that she followed up in our office and was given another antibiotic and a rocephin injection.  They informed her that she was also resistant to that medication and they are going to send another antibiotic.  Dr. Sedalia Muta was notified.

## 2023-08-17 ENCOUNTER — Other Ambulatory Visit: Payer: Self-pay | Admitting: Family Medicine

## 2023-08-17 DIAGNOSIS — Z1212 Encounter for screening for malignant neoplasm of rectum: Secondary | ICD-10-CM

## 2023-08-17 DIAGNOSIS — Z1211 Encounter for screening for malignant neoplasm of colon: Secondary | ICD-10-CM

## 2023-10-30 ENCOUNTER — Ambulatory Visit: Payer: BC Managed Care – PPO | Admitting: Family Medicine

## 2023-10-30 VITALS — BP 116/74 | HR 75 | Temp 98.7°F | Ht 63.0 in | Wt 148.0 lb

## 2023-10-30 DIAGNOSIS — N3001 Acute cystitis with hematuria: Secondary | ICD-10-CM

## 2023-10-30 DIAGNOSIS — N3000 Acute cystitis without hematuria: Secondary | ICD-10-CM | POA: Diagnosis not present

## 2023-10-30 LAB — POCT URINALYSIS DIP (CLINITEK)
Bilirubin, UA: NEGATIVE
Blood, UA: NEGATIVE
Glucose, UA: NEGATIVE mg/dL
Ketones, POC UA: NEGATIVE mg/dL
Nitrite, UA: NEGATIVE
POC PROTEIN,UA: NEGATIVE
Spec Grav, UA: 1.01 (ref 1.010–1.025)
Urobilinogen, UA: 0.2 U/dL
pH, UA: 6.5 (ref 5.0–8.0)

## 2023-10-30 MED ORDER — DOXYCYCLINE HYCLATE 100 MG PO TABS: 100.0000 mg | ORAL_TABLET | Freq: Two times a day (BID) | ORAL | 0 refills | Status: DC

## 2023-10-30 NOTE — Progress Notes (Signed)
Acute Office Visit  Subjective:    Patient ID: Jane Duncan, female    DOB: 1970-12-13, 52 y.o.   MRN: [id number]  Chief Complaint  Patient presents with   Urinary Tract Infection   HPI: Patient is in today for UTI symptoms started last night, c/o frequent urination nad mild nausea. No fever, chill, abd pain or lower back pain.  Past Medical History:  Diagnosis Date   COVID-19 08/15/2021   Kidney stone     Past Surgical History:  Procedure Laterality Date   ABDOMINAL HYSTERECTOMY     Unsure if abdominal or vaginal or if total or partial.    CESAREAN SECTION     X #    Family History  Problem Relation Age of Onset   CAD Other    Hypertension Other    CAD Father    Hypertension Brother     Social History   Socioeconomic History   Marital status: Married    Spouse name: Not on file   Number of children: Not on file   Years of education: Not on file   Highest education level: Not on file  Occupational History   Occupation: home maker  Tobacco Use   Smoking status: Never   Smokeless tobacco: Never  Substance and Sexual Activity   Alcohol use: Yes    Alcohol/week: 14.0 standard drinks of alcohol    Types: 14 Glasses of wine per week    Comment: 1-2 glasses of wine per night.   Drug use: Never   Sexual activity: Yes  Other Topics Concern   Not on file  Social History Narrative   Not on file   Social Drivers of Health   Financial Resource Strain: Not on file  Food Insecurity: Not on file  Transportation Needs: Not on file  Physical Activity: Not on file  Stress: Not on file  Social Connections: Not on file  Intimate Partner Violence: Not on file    Outpatient Medications Prior to Visit  Medication Sig Dispense Refill   fluconazole (DIFLUCAN) 150 MG tablet Take 1 tablet (150 mg total) by mouth daily. 3 tablet 0   doxycycline (VIBRA-TABS) 100 MG tablet Take 1 tablet (100 mg total) by mouth 2 (two) times daily. 14 tablet 0    sulfamethoxazole-trimethoprim (BACTRIM DS) 800-160 MG tablet Take 1 tablet by mouth 2 (two) times daily.     No facility-administered medications prior to visit.    Allergies  Allergen Reactions   Ciprofloxacin     MALAISE   Erythromycin Base Rash    Review of Systems  Constitutional:  Negative for chills and fever.  Gastrointestinal:  Positive for nausea. Negative for abdominal pain.  Genitourinary:  Positive for dysuria and frequency. Negative for flank pain and urgency.  Musculoskeletal:  Negative for back pain.       Objective:        10/30/2023    3:50 PM 06/18/2023    1:59 PM 05/29/2023    2:21 PM  Vitals with BMI  Height 5\' 3"  5\' 3"  5\' 3"   Weight 148 lbs 141 lbs 10 oz 142 lbs 6 oz  BMI 26.22 25.09 25.23  Systolic 116 110 409  Diastolic 74 72 64  Pulse 75 72 84    No data found.   Physical Exam Vitals reviewed.  Constitutional:      Appearance: Normal appearance. She is normal weight.  Cardiovascular:     Rate and Rhythm: Normal rate and regular rhythm.  Heart sounds: Normal heart sounds.  Pulmonary:     Effort: Pulmonary effort is normal. No respiratory distress.     Breath sounds: Normal breath sounds.  Abdominal:     General: Abdomen is flat. Bowel sounds are normal.     Palpations: Abdomen is soft.     Tenderness: There is no abdominal tenderness.  Neurological:     Mental Status: She is alert and oriented to person, place, and time.  Psychiatric:        Mood and Affect: Mood normal.        Behavior: Behavior normal.     Health Maintenance Due  Topic Date Due   HIV Screening  Never done   Hepatitis C Screening  Never done   DTaP/Tdap/Td (1 - Tdap) Never done   Cervical Cancer Screening (HPV/Pap Cotest)  Never done   Fecal DNA (Cologuard)  Never done   MAMMOGRAM  Never done   Zoster Vaccines- Shingrix (2 of 2) 09/29/2021   INFLUENZA VACCINE  06/14/2023   COVID-19 Vaccine (3 - 2024-25 season) 07/15/2023    There are no preventive care  reminders to display for this patient.   Lab Results  Component Value Date   TSH 1.950 02/10/2022   Lab Results  Component Value Date   WBC 5.5 05/29/2023   HGB 14.1 05/29/2023   HCT 42.1 05/29/2023   MCV 89 05/29/2023   PLT 233 05/29/2023   Lab Results  Component Value Date   NA 138 05/29/2023   K 5.0 05/29/2023   CO2 22 05/29/2023   GLUCOSE 110 (H) 05/29/2023   BUN 14 05/29/2023   CREATININE 0.63 05/29/2023   BILITOT 0.2 05/29/2023   ALKPHOS 76 05/29/2023   AST 34 05/29/2023   ALT 35 (H) 05/29/2023   PROT 6.8 05/29/2023   ALBUMIN 4.4 05/29/2023   CALCIUM 9.1 05/29/2023   EGFR 107 05/29/2023   Lab Results  Component Value Date   CHOL 214 (H) 08/04/2021   Lab Results  Component Value Date   HDL 104 08/04/2021   Lab Results  Component Value Date   LDLCALC 101 (H) 08/04/2021   Lab Results  Component Value Date   TRIG 49 08/04/2021   Lab Results  Component Value Date   CHOLHDL 2.1 08/04/2021   No results found for: "HGBA1C"     Assessment & Plan:  Acute cystitis without hematuria -     POCT URINALYSIS DIP (CLINITEK) -     Urine Culture -     Doxycycline Hyclate; Take 1 tablet (100 mg total) by mouth 2 (two) times daily.  Dispense: 14 tablet; Refill: 0     Meds ordered this encounter  Medications   doxycycline (VIBRA-TABS) 100 MG tablet    Sig: Take 1 tablet (100 mg total) by mouth 2 (two) times daily.    Dispense:  14 tablet    Refill:  0    Orders Placed This Encounter  Procedures   Urine Culture   POCT URINALYSIS DIP (CLINITEK)     Follow-up: No follow-ups on file.  An After Visit Summary was printed and given to the patient.  Blane Ohara, MD [facility] [phone]

## 2023-10-31 LAB — URINE CULTURE

## 2023-12-20 ENCOUNTER — Ambulatory Visit: Payer: 59

## 2023-12-20 VITALS — BP 110/70 | HR 61 | Temp 97.7°F | Ht 63.0 in | Wt 152.0 lb

## 2023-12-20 DIAGNOSIS — R300 Dysuria: Secondary | ICD-10-CM | POA: Diagnosis not present

## 2023-12-20 DIAGNOSIS — R350 Frequency of micturition: Secondary | ICD-10-CM | POA: Diagnosis not present

## 2023-12-20 LAB — POCT URINALYSIS DIP (CLINITEK)
Bilirubin, UA: NEGATIVE
Blood, UA: NEGATIVE
Glucose, UA: NEGATIVE mg/dL
Ketones, POC UA: NEGATIVE mg/dL
Leukocytes, UA: NEGATIVE
Nitrite, UA: NEGATIVE
POC PROTEIN,UA: NEGATIVE
Spec Grav, UA: <=1.005 — AB (ref 1.010–1.025)
Urobilinogen, UA: 0.2 U/dL
pH, UA: 6.5 (ref 5.0–8.0)

## 2023-12-20 MED ORDER — NITROFURANTOIN MACROCRYSTAL 100 MG PO CAPS: 100.0000 mg | ORAL_CAPSULE | Freq: Two times a day (BID) | ORAL | 0 refills | Status: AC

## 2023-12-20 NOTE — Progress Notes (Signed)
 Acute Office Visit  Subjective:    Patient ID: Jane Duncan, female    DOB: 05-27-1971, 53 y.o.   MRN: [id number]  Chief Complaint  Patient presents with   Dysuria    Discussed the use of AI scribe software for clinical note transcription with the patient, who gave verbal consent to proceed.    HPI: Patient is in today for urine frequency, discomfort, burning when urinating. Patient states it started yesterday. Jane Duncan is a 53 year old female with recurrent urinary tract infections who presents with symptoms suggestive of a UTI.  She has been experiencing symptoms suggestive of a urinary tract infection since yesterday morning, including dysuria and urinary frequency, along with a general feeling of discomfort. These symptoms subsided during the day but reappeared this morning. No flank pain, right side pain, or hematuria. She reports soreness in the lower abdomen.  She has a history of recurrent urinary tract infections throughout her life, with an increase in frequency and severity over the past one to two years. In August, she was treated with antibiotics at an urgent care facility for a UTI, but the initial antibiotic was ineffective, requiring a change in medication. A past urine test showed cloudy urine, large leukocytosis, and moderate RBCs, but with a negative culture result. She has not noticed any hematuria recently.  She drinks a significant amount of water daily, stating that she only consumes water and no other beverages. She has had a hysterectomy and is uncertain about her menopausal status, but she is likely postmenopausal, which may contribute to her increased risk of UTIs.  Past Medical History:  Diagnosis Date   COVID-19 08/15/2021   Kidney stone     Past Surgical History:  Procedure Laterality Date   ABDOMINAL HYSTERECTOMY     Unsure if abdominal or vaginal or if total or partial.    CESAREAN SECTION     X #    Family History  Problem  Relation Age of Onset   CAD Other    Hypertension Other    CAD Father    Hypertension Brother     Social History   Socioeconomic History   Marital status: Married    Spouse name: Not on file   Number of children: Not on file   Years of education: Not on file   Highest education level: Not on file  Occupational History   Occupation: home maker  Tobacco Use   Smoking status: Never   Smokeless tobacco: Never  Substance and Sexual Activity   Alcohol use: Yes    Alcohol/week: 14.0 standard drinks of alcohol    Types: 14 Glasses of wine per week    Comment: 1-2 glasses of wine per night.   Drug use: Never   Sexual activity: Yes  Other Topics Concern   Not on file  Social History Narrative   Not on file   Social Drivers of Health   Financial Resource Strain: Not on file  Food Insecurity: Not on file  Transportation Needs: Not on file  Physical Activity: Not on file  Stress: Not on file  Social Connections: Not on file  Intimate Partner Violence: Not on file    Outpatient Medications Prior to Visit  Medication Sig Dispense Refill   doxycycline  (VIBRA -TABS) 100 MG tablet Take 1 tablet (100 mg total) by mouth 2 (two) times daily. 14 tablet 0   fluconazole  (DIFLUCAN ) 150 MG tablet Take 1 tablet (150 mg total) by mouth daily. 3 tablet 0  No facility-administered medications prior to visit.    Allergies  Allergen Reactions   Ciprofloxacin     MALAISE   Erythromycin Base Rash    Review of Systems  Constitutional:  Negative for chills, fatigue and fever.  HENT:  Negative for congestion, ear pain and sinus pain.   Respiratory:  Negative for cough and shortness of breath.   Cardiovascular:  Negative for chest pain.  Gastrointestinal:  Negative for abdominal pain, constipation, diarrhea, nausea and vomiting.  Genitourinary:  Positive for difficulty urinating and frequency. Negative for dysuria.  Musculoskeletal:  Negative for myalgias.  Neurological:  Negative for  headaches.       Objective:        12/20/2023   10:47 AM 10/30/2023    3:50 PM 06/18/2023    1:59 PM  Vitals with BMI  Height 5' 3 5' 3 5' 3  Weight 152 lbs 148 lbs 141 lbs 10 oz  BMI 26.93 26.22 25.09  Systolic 110 116 889  Diastolic 70 74 72  Pulse 61 75 72    No data found.   Physical Exam Vitals and nursing note reviewed.  HENT:     Head: Atraumatic.  Cardiovascular:     Rate and Rhythm: Normal rate and regular rhythm.  Pulmonary:     Effort: Pulmonary effort is normal.     Breath sounds: Normal breath sounds.  Abdominal:     General: There is no distension.     Tenderness: There is no abdominal tenderness. There is no right CVA tenderness or left CVA tenderness.  Neurological:     Mental Status: She is alert.     Health Maintenance Due  Topic Date Due   HIV Screening  Never done   Hepatitis C Screening  Never done   DTaP/Tdap/Td (1 - Tdap) Never done   Cervical Cancer Screening (HPV/Pap Cotest)  Never done   Fecal DNA (Cologuard)  Never done   MAMMOGRAM  Never done   Zoster Vaccines- Shingrix  (2 of 2) 09/29/2021   COVID-19 Vaccine (3 - 2024-25 season) 07/15/2023    There are no preventive care reminders to display for this patient.   Lab Results  Component Value Date   TSH 1.950 02/10/2022   Lab Results  Component Value Date   WBC 5.5 05/29/2023   HGB 14.1 05/29/2023   HCT 42.1 05/29/2023   MCV 89 05/29/2023   PLT 233 05/29/2023   Lab Results  Component Value Date   NA 138 05/29/2023   K 5.0 05/29/2023   CO2 22 05/29/2023   GLUCOSE 110 (H) 05/29/2023   BUN 14 05/29/2023   CREATININE 0.63 05/29/2023   BILITOT 0.2 05/29/2023   ALKPHOS 76 05/29/2023   AST 34 05/29/2023   ALT 35 (H) 05/29/2023   PROT 6.8 05/29/2023   ALBUMIN 4.4 05/29/2023   CALCIUM 9.1 05/29/2023   EGFR 107 05/29/2023   Lab Results  Component Value Date   CHOL 214 (H) 08/04/2021   Lab Results  Component Value Date   HDL 104 08/04/2021   Lab Results   Component Value Date   LDLCALC 101 (H) 08/04/2021   Lab Results  Component Value Date   TRIG 49 08/04/2021   Lab Results  Component Value Date   CHOLHDL 2.1 08/04/2021   No results found for: HGBA1C     Assessment & Plan:  Urine frequency -     POCT URINALYSIS DIP (CLINITEK)  Dysuria Assessment & Plan: Presents with burning and frequent  urination since yesterday. Urinalysis shows no infection (no blood, protein, nitrite, leukocytosis). Specific gravity normal, urine clear. Likely bladder irritation without infection. History of recurrent UTIs. Discussed holding antibiotics unless symptoms worsen. Nitrofurantoin  is effective against E. Coli and other bacteria. - Prescribe nitrofurantoin  100 mg, twice daily for 5 days, to be taken only if symptoms worsen - Encourage continued high fluid intake - Recommend plant-based supplements that have been effective previously - Advise urgent care evaluation if symptoms do not improve or worsen  Postmenopausal Status Likely postmenopausal due to age (52 years) and hysterectomy. Increased risk of urinary infections due to estrogen deficiency. More frequent UTIs in the past 18 months. Discussed potential benefit of vaginal estrogen. - Refer to gynecologist for evaluation and potential vaginal estrogen treatment  General Health Maintenance Received influenza vaccine in January 2025. - Document influenza vaccination in medical record  Follow-up - Return for evaluation if symptoms persist or worsen - Follow-up with gynecologist for postmenopausal management.   Other orders -     Nitrofurantoin  Macrocrystal; Take 1 capsule (100 mg total) by mouth 2 (two) times daily for 5 days.  Dispense: 10 capsule; Refill: 0     Meds ordered this encounter  Medications   nitrofurantoin  (MACRODANTIN ) 100 MG capsule    Sig: Take 1 capsule (100 mg total) by mouth 2 (two) times daily for 5 days.    Dispense:  10 capsule    Refill:  0    Orders Placed  This Encounter  Procedures   POCT URINALYSIS DIP (CLINITEK)     Follow-up: Return if symptoms worsen or fail to improve.  An After Visit Summary was printed and given to the patient.  Dola Lunsford, MD [facility] [phone]

## 2023-12-20 NOTE — Assessment & Plan Note (Signed)
 Presents with burning and frequent urination since yesterday. Urinalysis shows no infection (no blood, protein, nitrite, leukocytosis). Specific gravity normal, urine clear. Likely bladder irritation without infection. History of recurrent UTIs. Discussed holding antibiotics unless symptoms worsen. Nitrofurantoin  is effective against E. Coli and other bacteria. - Prescribe nitrofurantoin  100 mg, twice daily for 5 days, to be taken only if symptoms worsen - Encourage continued high fluid intake - Recommend plant-based supplements that have been effective previously - Advise urgent care evaluation if symptoms do not improve or worsen  Postmenopausal Status Likely postmenopausal due to age (52 years) and hysterectomy. Increased risk of urinary infections due to estrogen deficiency. More frequent UTIs in the past 18 months. Discussed potential benefit of vaginal estrogen. - Refer to gynecologist for evaluation and potential vaginal estrogen treatment  General Health Maintenance Received influenza vaccine in January 2025. - Document influenza vaccination in medical record  Follow-up - Return for evaluation if symptoms persist or worsen - Follow-up with gynecologist for postmenopausal management.

## 2023-12-20 NOTE — Patient Instructions (Signed)
 VISIT SUMMARY:  Jane Duncan, a 53 year old female with a history of recurrent urinary tract infections, presented with symptoms suggestive of a UTI, including burning and frequent urination. A urinalysis showed no infection, and it was determined that she likely has bladder irritation without infection. Her postmenopausal status may be contributing to her increased risk of UTIs.  YOUR PLAN:  -BLADDER IRRITATION: Bladder irritation can cause symptoms similar to a urinary tract infection, such as burning and frequent urination, but without an actual infection. You are advised to take nitrofurantoin  100 mg twice daily for 5 days only if your symptoms worsen. Continue drinking plenty of water and consider using plant-based supplements that have helped you in the past. If your symptoms do not improve or worsen, seek urgent care evaluation.  -POSTMENOPAUSAL STATUS: Being postmenopausal can increase the risk of urinary infections due to lower estrogen levels. You are referred to a gynecologist to discuss the potential benefits of vaginal estrogen treatment, which may help reduce the frequency of UTIs.  -GENERAL HEALTH MAINTENANCE: Your influenza vaccination from January 2025 has been documented in your medical record. Continue maintaining a healthy lifestyle and stay up-to-date with routine health checks.  INSTRUCTIONS:  Please return for evaluation if your symptoms persist or worsen. Additionally, follow up with a gynecologist for postmenopausal management.

## 2024-01-01 ENCOUNTER — Other Ambulatory Visit: Payer: Self-pay | Admitting: Family Medicine

## 2024-01-01 DIAGNOSIS — Z1231 Encounter for screening mammogram for malignant neoplasm of breast: Secondary | ICD-10-CM

## 2024-01-07 ENCOUNTER — Telehealth: Payer: Self-pay

## 2024-01-07 NOTE — Telephone Encounter (Signed)
 Patient informed she would need appt to discuss current concerns and symptoms. Patient questioned if she could have labs drawn then based on results schedule an appt, informed patient it would be best to schedule appt so that appropriate labs can be ordered. Patient verbalized understanding and scheduled appt.  Copied from CRM [id number]. Topic: Clinical - Medical Advice >> Jan 07, 2024  8:35 AM Fonda Kinder J wrote: Reason for CRM: Pt is requesting a call back to ask a few questions about menopause and to see if she's going through it

## 2024-01-07 NOTE — Telephone Encounter (Signed)
 Patient states symptoms come and go. Informed her she could have an earlier appt however it would not be with Dr. Sedalia Muta. Patient declined and wanted to keep current appt as is.  Copied from CRM [id number]. Topic: Clinical - Medical Advice >> Jan 07, 2024  3:40 PM Tienna Bienkowski wrote: Reason for CRM: possible uti, burning and urgency, want to only see or talk to Dr.[facility]- [phone]

## 2024-01-14 ENCOUNTER — Ambulatory Visit: Payer: 59 | Admitting: Family Medicine

## 2024-01-14 VITALS — BP 120/80 | HR 72 | Temp 97.8°F | Ht 63.0 in | Wt 152.0 lb

## 2024-01-14 DIAGNOSIS — Z780 Asymptomatic menopausal state: Secondary | ICD-10-CM | POA: Diagnosis not present

## 2024-01-14 DIAGNOSIS — R300 Dysuria: Secondary | ICD-10-CM

## 2024-01-14 LAB — POCT URINALYSIS DIP (CLINITEK)
Bilirubin, UA: NEGATIVE
Blood, UA: NEGATIVE
Glucose, UA: NEGATIVE mg/dL
Ketones, POC UA: NEGATIVE mg/dL
Leukocytes, UA: NEGATIVE
Nitrite, UA: NEGATIVE
POC PROTEIN,UA: NEGATIVE
Spec Grav, UA: 1.015 (ref 1.010–1.025)
Urobilinogen, UA: NEGATIVE U/dL
pH, UA: 6 (ref 5.0–8.0)

## 2024-01-14 NOTE — Progress Notes (Signed)
 Acute Office Visit  Subjective:    Patient ID: Jane Duncan, female    DOB: 26-May-1971, 53 y.o.   MRN: [id number]  Chief Complaint  Patient presents with   Requesting labs be drawn    Discussed the use of AI scribe software for clinical note transcription with the patient, who gave verbal consent to proceed.      HPI: Patient is in today requesting labs be drawn to check for menopause. Dysuria. No vaginal dryness. Frequent urination. Lasts for 1-2 hours and then goes away.  Abdominal hysterectomy PARTIAL. Had symptoms 1 week ago and took some antibiotic she had on hand.  Took AZO and cranberry pills (AZO.)  Some hot flashes. Maybe some irrirtability.   Past Medical History:  Diagnosis Date   COVID-19 08/15/2021   Kidney stone     Past Surgical History:  Procedure Laterality Date   ABDOMINAL HYSTERECTOMY     Unsure if abdominal or vaginal or if total or partial.    CESAREAN SECTION     X #    Family History  Problem Relation Age of Onset   CAD Other    Hypertension Other    CAD Father    Hypertension Brother     Social History   Socioeconomic History   Marital status: Married    Spouse name: Not on file   Number of children: Not on file   Years of education: Not on file   Highest education level: Not on file  Occupational History   Occupation: home maker  Tobacco Use   Smoking status: Never   Smokeless tobacco: Never  Substance and Sexual Activity   Alcohol use: Yes    Alcohol/week: 14.0 standard drinks of alcohol    Types: 14 Glasses of wine per week    Comment: 1-2 glasses of wine per night.   Drug use: Never   Sexual activity: Yes  Other Topics Concern   Not on file  Social History Narrative   Not on file   Social Drivers of Health   Financial Resource Strain: Not on file  Food Insecurity: Not on file  Transportation Needs: Not on file  Physical Activity: Not on file  Stress: Not on file  Social Connections: Not on file  Intimate  Partner Violence: Not on file    No outpatient medications prior to visit.   No facility-administered medications prior to visit.    Allergies  Allergen Reactions   Ciprofloxacin     MALAISE   Erythromycin Base Rash    Review of Systems  Constitutional:  Negative for chills, fatigue and fever.  HENT:  Negative for congestion, ear pain, rhinorrhea and sore throat.   Respiratory:  Negative for cough and shortness of breath.   Cardiovascular:  Negative for chest pain.  Gastrointestinal:  Negative for abdominal pain, constipation, diarrhea, nausea and vomiting.  Genitourinary:  Negative for dysuria and urgency.  Musculoskeletal:  Negative for back pain and myalgias.  Neurological:  Negative for dizziness, weakness, light-headedness and headaches.  Psychiatric/Behavioral:  Negative for dysphoric mood. The patient is not nervous/anxious.        Objective:        01/14/2024    4:19 PM 12/20/2023   10:47 AM 10/30/2023    3:50 PM  Vitals with BMI  Height 5\' 3"  5\' 3"  5\' 3"   Weight 152 lbs 152 lbs 148 lbs  BMI 26.93 26.93 26.22  Systolic 120 110 130  Diastolic 80 70 74  Pulse 72  61 75    No data found.   Physical Exam Vitals reviewed.  Constitutional:      Appearance: Normal appearance. She is normal weight.  Cardiovascular:     Rate and Rhythm: Normal rate and regular rhythm.     Heart sounds: Normal heart sounds.  Pulmonary:     Effort: Pulmonary effort is normal. No respiratory distress.     Breath sounds: Normal breath sounds.  Abdominal:     General: Abdomen is flat. Bowel sounds are normal.     Palpations: Abdomen is soft.     Tenderness: There is no abdominal tenderness.  Neurological:     Mental Status: She is alert and oriented to person, place, and time.  Psychiatric:        Mood and Affect: Mood normal.        Behavior: Behavior normal.     Health Maintenance Due  Topic Date Due   HIV Screening  Never done   Hepatitis C Screening  Never done    DTaP/Tdap/Td (1 - Tdap) Never done   Cervical Cancer Screening (HPV/Pap Cotest)  Never done   Fecal DNA (Cologuard)  Never done   MAMMOGRAM  Never done   Zoster Vaccines- Shingrix (2 of 2) 09/29/2021   COVID-19 Vaccine (3 - 2024-25 season) 07/15/2023    There are no preventive care reminders to display for this patient.   Lab Results  Component Value Date   TSH 1.850 01/14/2024   Lab Results  Component Value Date   WBC 5.5 05/29/2023   HGB 14.1 05/29/2023   HCT 42.1 05/29/2023   MCV 89 05/29/2023   PLT 233 05/29/2023   Lab Results  Component Value Date   NA 138 05/29/2023   K 5.0 05/29/2023   CO2 22 05/29/2023   GLUCOSE 110 (H) 05/29/2023   BUN 14 05/29/2023   CREATININE 0.63 05/29/2023   BILITOT 0.2 05/29/2023   ALKPHOS 76 05/29/2023   AST 34 05/29/2023   ALT 35 (H) 05/29/2023   PROT 6.8 05/29/2023   ALBUMIN 4.4 05/29/2023   CALCIUM 9.1 05/29/2023   EGFR 107 05/29/2023   Lab Results  Component Value Date   CHOL 214 (H) 08/04/2021   Lab Results  Component Value Date   HDL 104 08/04/2021   Lab Results  Component Value Date   LDLCALC 101 (H) 08/04/2021   Lab Results  Component Value Date   TRIG 49 08/04/2021   Lab Results  Component Value Date   CHOLHDL 2.1 08/04/2021   No results found for: "HGBA1C"     Assessment & Plan:  Menopause Assessment & Plan: Check UA Check labs Came back consistent with menopause.  Start on estradiol 0.5 mg daily.  Increase if needed.    Orders: -     FSH/LH -     TSH -     POCT URINALYSIS DIP (CLINITEK)  Dysuria Assessment & Plan: NO UTI  Orders: -     POCT URINALYSIS DIP (CLINITEK)     No orders of the defined types were placed in this encounter.   Orders Placed This Encounter  Procedures   FSH/LH   TSH   POCT URINALYSIS DIP (CLINITEK)     Follow-up: Return if symptoms worsen or fail to improve.  An After Visit Summary was printed and given to the patient.   Clayborn Bigness I Leal-Borjas,acting  as a scribe for Blane Ohara, MD.,have documented all relevant documentation on the behalf of Blane Ohara, MD,as directed by  Blane Ohara, MD while in the presence of Blane Ohara, MD.   Blane Ohara, MD [facility] [phone]

## 2024-01-15 ENCOUNTER — Telehealth: Payer: Self-pay

## 2024-01-15 ENCOUNTER — Other Ambulatory Visit: Payer: Self-pay

## 2024-01-15 ENCOUNTER — Other Ambulatory Visit: Payer: Self-pay | Admitting: Family Medicine

## 2024-01-15 ENCOUNTER — Encounter: Payer: Self-pay | Admitting: Family Medicine

## 2024-01-15 LAB — TSH: TSH: 1.85 u[IU]/mL (ref 0.450–4.500)

## 2024-01-15 LAB — FSH/LH
FSH: 49 m[IU]/mL
LH: 52.8 m[IU]/mL

## 2024-01-15 MED ORDER — ESTRADIOL 0.5 MG PO TABS: 0.5000 mg | ORAL_TABLET | Freq: Every day | ORAL | 0 refills | Status: DC

## 2024-01-15 NOTE — Telephone Encounter (Signed)
 Verifying dose for estrace 0.3? There is not an option for 0.3. The dose listed is 0.5 and up... Please advise?

## 2024-01-18 ENCOUNTER — Encounter: Payer: Self-pay | Admitting: Family Medicine

## 2024-01-18 DIAGNOSIS — Z780 Asymptomatic menopausal state: Secondary | ICD-10-CM | POA: Insufficient documentation

## 2024-01-18 NOTE — Assessment & Plan Note (Addendum)
 Check UA Check labs Came back consistent with menopause.  Start on estradiol 0.5 mg daily.  Increase if needed.

## 2024-01-20 NOTE — Assessment & Plan Note (Signed)
NO UTI

## 2024-01-23 ENCOUNTER — Encounter: Payer: Self-pay | Admitting: Physician Assistant

## 2024-01-23 ENCOUNTER — Ambulatory Visit: Admitting: Physician Assistant

## 2024-01-23 VITALS — BP 110/80 | HR 79 | Temp 97.4°F | Resp 14 | Ht 63.0 in | Wt 150.0 lb

## 2024-01-23 DIAGNOSIS — R051 Acute cough: Secondary | ICD-10-CM | POA: Diagnosis not present

## 2024-01-23 LAB — POCT INFLUENZA A/B
Influenza A, POC: NEGATIVE
Influenza B, POC: NEGATIVE

## 2024-01-23 LAB — POCT RAPID STREP A (OFFICE): Rapid Strep A Screen: NEGATIVE

## 2024-01-23 LAB — POC COVID19 BINAXNOW: SARS Coronavirus 2 Ag: NEGATIVE

## 2024-01-23 MED ORDER — HYDROCOD POLI-CHLORPHE POLI ER 10-8 MG/5ML PO SUER: 5.0000 mL | Freq: Two times a day (BID) | ORAL | 0 refills | Status: DC | PRN

## 2024-01-23 MED ORDER — SULFAMETHOXAZOLE-TRIMETHOPRIM 800-160 MG PO TABS: 1.0000 | ORAL_TABLET | Freq: Two times a day (BID) | ORAL | 0 refills | Status: DC

## 2024-01-23 NOTE — Assessment & Plan Note (Signed)
 Cough with green mucus and fatigue, likely pertussis. 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 Lungs clear with slight wheeze, inflammation suspected. Allergic to codeine and erythromycin. Bactrim chosen to prevent severe bronchitis. - Prescribe Bactrim for 5 days. - Prescribe Desenex cough medicine. - Advise monitoring for increased cough severity, deeper sound, or high fever, and report if symptoms worsen for potential chest x-ray.

## 2024-01-23 NOTE — Progress Notes (Signed)
 Acute Office Visit  Subjective:    Patient ID: Jane Duncan, female    DOB: [birth year], 53 y.o.   MRN: [id number]  Chief Complaint  Patient presents with   Cough    HPI: Patient is in today for productive cough with greenish sputum, headache, sob, chest congestion, sore throat since 3 days ago. Patient denies fever, chills, chest pain. She tried Delsyum, but it did not help.  Discussed the use of AI scribe software for clinical note transcription with the patient, who gave verbal consent to proceed.  History of Present Illness   The patient, who works in an [employer], presents with a cough that started on Sunday. The cough is productive with green mucus. She denies having a fever but reports feeling extremely tired and has had a headache for a couple of days. The patient has a history of severe coughs that are usually managed with codeine, but she can no longer take it due to side effects. The patient also reports a runny nose and coughing up stuff, suggesting possible sinus drainage. She has not vomited but reports gagging due to the cough. The patient also reports pain, but it is unclear where the pain is located.      Past Medical History:  Diagnosis Date   COVID-19 08/15/2021   Kidney stone     Past Surgical History:  Procedure Laterality Date   ABDOMINAL HYSTERECTOMY     Unsure if abdominal or vaginal or if total or partial.    CESAREAN SECTION     X #    Family History  Problem Relation Age of Onset   CAD Other    Hypertension Other    CAD Father    Hypertension Brother     Social History   Socioeconomic History   Marital status: Married    Spouse name: Not on file   Number of children: Not on file   Years of education: Not on file   Highest education level: Not on file  Occupational History   Occupation: home maker  Tobacco Use   Smoking status: Never   Smokeless tobacco: Never  Substance and Sexual Activity   Alcohol use: Yes     Alcohol/week: 14.0 standard drinks of alcohol    Types: 14 Glasses of wine per week    Comment: 1-2 glasses of wine per night.   Drug use: Never   Sexual activity: Yes  Other Topics Concern   Not on file  Social History Narrative   Not on file   Social Drivers of Health   Financial Resource Strain: Not on file  Food Insecurity: Not on file  Transportation Needs: Not on file  Physical Activity: Not on file  Stress: Not on file  Social Connections: Not on file  Intimate Partner Violence: Not on file    Outpatient Medications Prior to Visit  Medication Sig Dispense Refill   estradiol (ESTRACE) 0.5 MG tablet Take 1 tablet (0.5 mg total) by mouth daily. 90 tablet 0   No facility-administered medications prior to visit.    Allergies  Allergen Reactions   Ciprofloxacin     MALAISE   Erythromycin Base Rash    Review of Systems  Constitutional:  Negative for appetite change, fatigue and fever.  HENT:  Positive for congestion, sinus pressure and sore throat. Negative for ear pain.   Respiratory:  Positive for cough and shortness of breath. Negative for chest tightness and wheezing.   Cardiovascular:  Negative for chest pain  and palpitations.  Gastrointestinal:  Negative for abdominal pain, constipation, diarrhea, nausea and vomiting.  Genitourinary:  Negative for dysuria and hematuria.  Musculoskeletal:  Negative for arthralgias, back pain, joint swelling and myalgias.  Skin:  Negative for rash.  Neurological:  Positive for headaches. Negative for dizziness and weakness.  Psychiatric/Behavioral:  Negative for dysphoric mood. The patient is not nervous/anxious.        Objective:        01/23/2024   10:14 AM 01/14/2024    4:19 PM 12/20/2023   10:47 AM  Vitals with BMI  Height 5\' 3"  5\' 3"  5\' 3"   Weight 150 lbs 152 lbs 152 lbs  BMI 26.58 26.93 26.93  Systolic 110 120 782  Diastolic 80 80 70  Pulse 79 72 61    No data found.   Physical Exam Vitals reviewed.   Constitutional:      Appearance: Normal appearance.  HENT:     Right Ear: Hearing and tympanic membrane normal.     Left Ear: Hearing and tympanic membrane normal.     Nose:     Right Sinus: Frontal sinus tenderness present. No maxillary sinus tenderness.     Left Sinus: Frontal sinus tenderness present. No maxillary sinus tenderness.     Mouth/Throat:     Mouth: Mucous membranes are moist.     Pharynx: Oropharynx is clear. Posterior oropharyngeal erythema present.  Cardiovascular:     Rate and Rhythm: Normal rate and regular rhythm.     Heart sounds: Normal heart sounds.  Pulmonary:     Effort: Pulmonary effort is normal.     Breath sounds: Wheezing present.  Abdominal:     General: Bowel sounds are normal.     Palpations: Abdomen is soft.     Tenderness: There is no abdominal tenderness.  Neurological:     Mental Status: She is alert and oriented to person, place, and time.  Psychiatric:        Mood and Affect: Mood normal.        Behavior: Behavior normal.     Health Maintenance Due  Topic Date Due   HIV Screening  Never done   Hepatitis C Screening  Never done   DTaP/Tdap/Td (1 - Tdap) Never done   Cervical Cancer Screening (HPV/Pap Cotest)  Never done   Fecal DNA (Cologuard)  Never done   MAMMOGRAM  Never done   Zoster Vaccines- Shingrix (2 of 2) 09/29/2021   COVID-19 Vaccine (3 - 2024-25 season) 07/15/2023    There are no preventive care reminders to display for this patient.   Lab Results  Component Value Date   TSH 1.850 01/14/2024   Lab Results  Component Value Date   WBC 5.5 05/29/2023   HGB 14.1 05/29/2023   HCT 42.1 05/29/2023   MCV 89 05/29/2023   PLT 233 05/29/2023   Lab Results  Component Value Date   NA 138 05/29/2023   K 5.0 05/29/2023   CO2 22 05/29/2023   GLUCOSE 110 (H) 05/29/2023   BUN 14 05/29/2023   CREATININE 0.63 05/29/2023   BILITOT 0.2 05/29/2023   ALKPHOS 76 05/29/2023   AST 34 05/29/2023   ALT 35 (H) 05/29/2023   PROT  6.8 05/29/2023   ALBUMIN 4.4 05/29/2023   CALCIUM 9.1 05/29/2023   EGFR 107 05/29/2023   Lab Results  Component Value Date   CHOL 214 (H) 08/04/2021   Lab Results  Component Value Date   HDL 104 08/04/2021   Lab  Results  Component Value Date   LDLCALC 101 (H) 08/04/2021   Lab Results  Component Value Date   TRIG 49 08/04/2021   Lab Results  Component Value Date   CHOLHDL 2.1 08/04/2021   No results found for: "HGBA1C"     Assessment & Plan:  Acute cough Assessment & Plan: Cough with green mucus and fatigue, likely pertussis. 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 Lungs clear with slight wheeze, inflammation suspected. Allergic to codeine and erythromycin. Bactrim chosen to prevent severe bronchitis. - Prescribe Bactrim for 5 days. - Prescribe Desenex cough medicine. - Advise monitoring for increased cough severity, deeper sound, or high fever, and report if symptoms worsen for potential chest x-ray.  Orders: -     POCT Influenza A/B -     POC COVID-19 BinaxNow -     POCT rapid strep A -     Hydrocod Poli-Chlorphe Poli ER; Take 5 mLs by mouth every 12 (twelve) hours as needed for cough (While having cough).  Dispense: 115 mL; Refill: 0 -     Sulfamethoxazole-Trimethoprim; Take 1 tablet by mouth 2 (two) times daily.  Dispense: 10 tablet; Refill: 0     Assessment and Plan   Meds ordered this encounter  Medications   chlorpheniramine-HYDROcodone (TUSSIONEX) 10-8 MG/5ML    Sig: Take 5 mLs by mouth every 12 (twelve) hours as needed for cough (While having cough).    Dispense:  115 mL    Refill:  0   sulfamethoxazole-trimethoprim (BACTRIM DS) 800-160 MG tablet    Sig: Take 1 tablet by mouth 2 (two) times daily.    Dispense:  10 tablet    Refill:  0    Orders Placed This Encounter  Procedures   Influenza A/B   POC COVID-19   POCT rapid strep A     Follow-up: Return if symptoms worsen or fail to improve.  An After Visit Summary was printed and given to the  patient.     I,Lauren M Auman,acting as a Neurosurgeon for [employer], [employer].,have documented all relevant documentation on the behalf of Langley Gauss, PA,as directed by  Langley Gauss, PA while in the presence of Langley Gauss, [place].    Langley Gauss, [place] [facility] [phone]

## 2024-01-23 NOTE — Patient Instructions (Signed)
 VISIT SUMMARY:  You came in today with a productive cough that started on Sunday, along with fatigue, a headache, and a runny nose. 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 You have a history of severe coughs but can no longer take codeine due to side effects. Based on your symptoms, we suspect pertussis (whooping cough).  YOUR PLAN:  -ACUTE COUGH WITH SUSPECTED PERTUSSIS: Pertussis, also known as whooping cough, is a highly contagious respiratory tract infection. You have been prescribed Bactrim for 5 days to prevent severe bronchitis and Desenex cough medicine to help manage your symptoms. Please monitor your symptoms closely. If your cough becomes more severe, sounds deeper, or if you develop a high fever, contact us immediately as you may need a chest x-ray.  -GENERAL HEALTH MAINTENANCE: We need to review your immunization history to ensure your tetanus vaccination is up to date, as this is relevant given the suspicion of pertussis.  INSTRUCTIONS:  Please take Bactrim as prescribed for 5 days and use Desenex cough medicine as needed. Monitor your symptoms and report any worsening, such as increased cough severity, deeper cough sound, or high fever. We will also review your immunization history to update your tetanus vaccination if necessary.

## 2024-01-24 ENCOUNTER — Ambulatory Visit: Payer: Self-pay | Admitting: Family Medicine

## 2024-01-24 ENCOUNTER — Encounter: Payer: Self-pay | Admitting: Physician Assistant

## 2024-01-24 NOTE — Telephone Encounter (Signed)
  Chief Complaint: itching Symptoms: widespread itching/fidgeting overnight Frequency: last night Pertinent Negatives: Patient denies rash, sob, fever, redness Disposition: [] ED /[] Urgent Care (no appt availability in office) / [] Appointment(In office/virtual)/ []  [facility]/ [] Home Care/ [] Refused Recommended Disposition /[] [facility]/ [x]  Follow-up with PCP Additional Notes: Patient reports that she was prescribed a cough medicine and an antibiotic when seen in office yesterday for bronchitis. Patient reports the last time she took a cough medication with codeine in it it made her "fidgety and itchy but only overnight". Patient reports the same thing occurred last night after taking it. Patient reports in the past Dr Sedalia Muta prescribed a cough medicine that did not have codeine in it and she did not have that reaction, so would like to be prescribed something different. Patient denies current itching/fidgeting, rash, sob, redness, and signs of allergic reaction. Advised patient would forward request to appropriate person for follow-up. Advised patient to call back if itching returns, rash occurs, or any worsening symptoms. Patient verbalized understanding.      Copied from CRM [id number]. Topic: Clinical - Red Word Triage >> Jan 24, 2024  8:13 AM Tiffany B wrote: Red Word that prompted transfer to Nurse Triage: Patient states she was recently prescribed a medication that caused an allergic reaction, caller states it made her itchy all night and she's fidgety due to the medication prescribed on 01/23/2024. Caller states she does know the name but she can take anything codeine. Reason for Disposition  Taking prescription medication that could cause itching (e.g., codeine/morphine/other opiates, aspirin)  Answer Assessment - Initial Assessment Questions 1. DESCRIPTION: "Describe the itching you are having."     Itching all night and fidgety, fine now 2. SEVERITY: "How bad is it?"     - MILD: Doesn't interfere with normal activities.   - MODERATE-SEVERE: Interferes with work, school, sleep, or other activities.      Moderate, fidgeting and itching interferes with sleep 3. SCRATCHING: "Are there any scratch marks? Bleeding?"  No scratch marks or bleeding 4. ONSET: "When did this begin?"      Last night 5. CAUSE: "What do you think is causing the itching?" (ask about swimming pools, pollen, animals, soaps, etc.)     Was prescribed medication yesterday, cough med and antibiotic, codeine reaction 6. OTHER SYMPTOMS: "Do you have any other symptoms?"      Fidgety  Protocols used: Itching - [facility]

## 2024-01-25 ENCOUNTER — Other Ambulatory Visit: Payer: Self-pay

## 2024-01-25 ENCOUNTER — Telehealth: Payer: Self-pay

## 2024-01-25 ENCOUNTER — Other Ambulatory Visit: Payer: Self-pay | Admitting: Family Medicine

## 2024-01-25 MED ORDER — PROMETHAZINE-DM 6.25-15 MG/5ML PO SYRP: 5.0000 mL | ORAL_SOLUTION | Freq: Four times a day (QID) | ORAL | 0 refills | Status: DC | PRN

## 2024-01-25 MED ORDER — BENZONATATE 200 MG PO CAPS: 200.0000 mg | ORAL_CAPSULE | Freq: Three times a day (TID) | ORAL | 0 refills | Status: DC | PRN

## 2024-01-25 NOTE — Progress Notes (Signed)
 Patient called stating that she can not take anything with codeine or nothing with Hydrocodone for her cough. She also states that Tessalon pearls do not work for her. Consulted with Dr. Sedalia Muta and she stated she would send in Promethazine/Dextromorphan cough syrup that does not have any Codeine or Hydrocodone in it. Patient understood verbally.

## 2024-01-25 NOTE — Telephone Encounter (Signed)
 Rx refill

## 2024-02-05 ENCOUNTER — Ambulatory Visit
Admission: RE | Admit: 2024-02-05 | Discharge: 2024-02-05 | Disposition: A | Discharge status: Home / Self Care | Payer: 59 | Source: Ambulatory Visit | Attending: Family Medicine | Admitting: Family Medicine

## 2024-02-05 DIAGNOSIS — Z1231 Encounter for screening mammogram for malignant neoplasm of breast: Secondary | ICD-10-CM

## 2024-02-07 ENCOUNTER — Encounter: Payer: Self-pay | Admitting: Family Medicine

## 2024-03-24 ENCOUNTER — Encounter: Payer: Self-pay | Admitting: Family Medicine

## 2024-03-24 ENCOUNTER — Other Ambulatory Visit: Payer: Self-pay | Admitting: Family Medicine

## 2024-03-24 ENCOUNTER — Telehealth: Payer: Self-pay

## 2024-03-24 DIAGNOSIS — R300 Dysuria: Secondary | ICD-10-CM

## 2024-03-24 DIAGNOSIS — R350 Frequency of micturition: Secondary | ICD-10-CM

## 2024-03-24 MED ORDER — ESTRADIOL 0.5 MG PO TABS: 0.5000 mg | ORAL_TABLET | Freq: Every day | ORAL | 0 refills | Status: DC

## 2024-03-24 NOTE — Telephone Encounter (Signed)
 Last Fill: 01/15/24   Last OV: 01/23/24 ACUTE Next OV: None Scheduled  Routing to provider for review/authorization.

## 2024-03-24 NOTE — Telephone Encounter (Signed)
 Copied from CRM [id number]. Topic: Clinical - Medication Refill >> Mar 24, 2024  2:50 PM Kevelyn M wrote: Medication: estradiol  (ESTRACE ) 0.5 MG tablet  Has the patient contacted their pharmacy? No, patient stated there were no refills left (Agent: If no, request that the patient contact the pharmacy for the refill. If patient does not wish to contact the pharmacy document the reason why and proceed with request.) (Agent: If yes, when and what did the pharmacy advise?)  This is the patient's preferred pharmacy:  [address] [place], [place] - [address] [place] [address] Phone: [phone] Fax: [phone]  Is this the correct pharmacy for this prescription? Yes If no, delete pharmacy and type the correct one.   Has the prescription been filled recently? No  Is the patient out of the medication? No  Has the patient been seen for an appointment in the last year OR does the patient have an upcoming appointment? Yes  Can we respond through MyChart? No  Agent: Please be advised that Rx refills may take up to 3 business days. We ask that you follow-up with your pharmacy.

## 2024-03-24 NOTE — Telephone Encounter (Unsigned)
 Copied from CRM [id number]. Topic: Referral - Question >> Mar 24, 2024  2:48 PM Kevelyn M wrote: Reason for CRM: Patient would like a referral request to see a urologist.

## 2024-03-31 NOTE — Telephone Encounter (Signed)
 Spoke with Pt about referral to urology.

## 2024-07-23 ENCOUNTER — Other Ambulatory Visit: Payer: Self-pay | Admitting: Family Medicine

## 2024-07-23 MED ORDER — ESTRADIOL 0.5 MG PO TABS: 0.5000 mg | ORAL_TABLET | Freq: Every day | ORAL | 2 refills | Status: AC

## 2024-07-23 NOTE — Telephone Encounter (Signed)
 Copied from CRM [id number]. Topic: Clinical - Medication Refill >> Jul 23, 2024  1:32 PM Ivette P wrote: Medication: estradiol  (ESTRACE ) 0.5 MG tablet  Has the patient contacted their pharmacy? Yes (Agent: If no, request that the patient contact the pharmacy for the refill. If patient does not wish to contact the pharmacy document the reason why and proceed with request.) (Agent: If yes, when and what did the pharmacy advise?)  This is the patient's preferred pharmacy:  [address] [place], [place] - [address] [place] [address] Phone: [phone] Fax: [phone]  Is this the correct pharmacy for this prescription? Yes If no, delete pharmacy and type the correct one.   Has the prescription been filled recently? No  Is the patient out of the medication? Yes  Has the patient been seen for an appointment in the last year OR does the patient have an upcoming appointment? Yes  Can we respond through MyChart? Yes  Agent: Please be advised that Rx refills may take up to 3 business days. We ask that you follow-up with your pharmacy.

## 2024-10-06 ENCOUNTER — Ambulatory Visit: Payer: Self-pay

## 2024-10-06 NOTE — Telephone Encounter (Signed)
 FYI Only or Action Required?: FYI only for provider: appointment scheduled on tomorrow.  Patient was last seen in primary care on 01/23/2024 by Milon Cleaves, PA.  Called Nurse Triage reporting Cough.  Symptoms began several days ago.  Interventions attempted:  Dayquil.  Symptoms are: gradually worsening.  Triage Disposition: Home Care  Patient/caregiver understands and will follow disposition?: No - pt wants to be seen.                         Copied from CRM #[id number]. Topic: Clinical - Red Word Triage >> Oct 06, 2024  7:38 AM Berwyn MATSU wrote: Red Word that prompted transfer to Nurse Triage: cough, no voice, drainage runny nose. Not getting better Reason for Disposition  Cough  Answer Assessment - Initial Assessment Questions 1. ONSET: When did the cough begin?      Today 2. SEVERITY: How bad is the cough today?      Near constant 3. SPUTUM: Describe the color of your sputum (e.g., none, dry cough; clear, white, yellow, green)     green 4. HEMOPTYSIS: Are you coughing up any blood? If Yes, ask: How much? (e.g., flecks, streaks, tablespoons, etc.)     no 5. DIFFICULTY BREATHING: Are you having difficulty breathing? If Yes, ask: How bad is it? (e.g., mild, moderate, severe)      no 6. FEVER: Do you have a fever? If Yes, ask: What is your temperature, how was it measured, and when did it start?     no 7. CARDIAC HISTORY: Do you have any history of heart disease? (e.g., heart attack, congestive heart failure)      no 8. LUNG HISTORY: Do you have any history of lung disease?  (e.g., pulmonary embolus, asthma, emphysema)     no  10. OTHER SYMPTOMS: Do you have any other symptoms? (e.g., runny nose, wheezing, chest pain)       Lost voice - Saturday morning, Started feeling poorly Thursday. Sinus drainage.  Protocols used: Cough - Acute Productive-A-AH

## 2024-10-06 NOTE — Progress Notes (Unsigned)
 Acute Office Visit  Subjective:    Patient ID: Jane Duncan, female    DOB: 08-09-1971, 53 y.o.   MRN: [id number]  No chief complaint on file.   HPI: Patient is in today for ***  Discussed the use of AI scribe software for clinical note transcription with the patient, who gave verbal consent to proceed.  History of Present Illness     Past Medical History:  Diagnosis Date   COVID-19 08/15/2021   Kidney stone     Past Surgical History:  Procedure Laterality Date   ABDOMINAL HYSTERECTOMY     Unsure if abdominal or vaginal or if total or partial.    CESAREAN SECTION     X #    Family History  Problem Relation Age of Onset   CAD Other    Hypertension Other    CAD Father    Hypertension Brother     Social History   Socioeconomic History   Marital status: Married    Spouse name: Not on file   Number of children: Not on file   Years of education: Not on file   Highest education level: Not on file  Occupational History   Occupation: home maker  Tobacco Use   Smoking status: Never   Smokeless tobacco: Never  Substance and Sexual Activity   Alcohol use: Yes    Alcohol/week: 14.0 standard drinks of alcohol    Types: 14 Glasses of wine per week    Comment: 1-2 glasses of wine per night.   Drug use: Never   Sexual activity: Yes  Other Topics Concern   Not on file  Social History Narrative   Not on file   Social Drivers of Health   Financial Resource Strain: Not on file  Food Insecurity: Not on file  Transportation Needs: Not on file  Physical Activity: Not on file  Stress: Not on file  Social Connections: Not on file  Intimate Partner Violence: Not on file    Outpatient Medications Prior to Visit  Medication Sig Dispense Refill   benzonatate  (TESSALON ) 200 MG capsule Take 1 capsule (200 mg total) by mouth 3 (three) times daily as needed for cough. 30 capsule 0   estradiol  (ESTRACE ) 0.5 MG tablet Take 1 tablet (0.5 mg total) by mouth daily. 90  tablet 2   promethazine -dextromethorphan (PROMETHAZINE -DM) 6.25-15 MG/5ML syrup Take 5 mLs by mouth 4 (four) times daily as needed for cough. 118 mL 0   sulfamethoxazole -trimethoprim  (BACTRIM  DS) 800-160 MG tablet Take 1 tablet by mouth 2 (two) times daily. 10 tablet 0   No facility-administered medications prior to visit.    Allergies  Allergen Reactions   Ciprofloxacin     MALAISE   Erythromycin Base Rash    Review of Systems     Objective:        01/23/2024   10:14 AM 01/14/2024    4:19 PM 12/20/2023   10:47 AM  Vitals with BMI  Height 5' 3 5' 3 5' 3  Weight 150 lbs 152 lbs 152 lbs  BMI 26.58 26.93 26.93  Systolic 110 120 889  Diastolic 80 80 70  Pulse 79 72 61    No data found.   Physical Exam  Health Maintenance Due  Topic Date Due   HIV Screening  Never done   Hepatitis C Screening  Never done   DTaP/Tdap/Td (1 - Tdap) Never done   Hepatitis B Vaccines 19-59 Average Risk (1 of 3 - 19+ 3-dose series) Never  done   Cervical Cancer Screening (HPV/Pap Cotest)  Never done   Fecal DNA (Cologuard)  Never done   Pneumococcal Vaccine: 50+ Years (1 of 1 - PCV) Never done   Zoster Vaccines- Shingrix  (2 of 2) 09/29/2021   Influenza Vaccine  06/13/2024   COVID-19 Vaccine (3 - 2025-26 season) 07/14/2024       Topic Date Due   Hepatitis B Vaccines 19-59 Average Risk (1 of 3 - 19+ 3-dose series) Never done     Lab Results  Component Value Date   TSH 1.850 01/14/2024   Lab Results  Component Value Date   WBC 5.5 05/29/2023   HGB 14.1 05/29/2023   HCT 42.1 05/29/2023   MCV 89 05/29/2023   PLT 233 05/29/2023   Lab Results  Component Value Date   NA 138 05/29/2023   K 5.0 05/29/2023   CO2 22 05/29/2023   GLUCOSE 110 (H) 05/29/2023   BUN 14 05/29/2023   CREATININE 0.63 05/29/2023   BILITOT 0.2 05/29/2023   ALKPHOS 76 05/29/2023   AST 34 05/29/2023   ALT 35 (H) 05/29/2023   PROT 6.8 05/29/2023   ALBUMIN 4.4 05/29/2023   CALCIUM 9.1 05/29/2023   EGFR  107 05/29/2023   Lab Results  Component Value Date   CHOL 214 (H) 08/04/2021   Lab Results  Component Value Date   HDL 104 08/04/2021   Lab Results  Component Value Date   LDLCALC 101 (H) 08/04/2021   Lab Results  Component Value Date   TRIG 49 08/04/2021   Lab Results  Component Value Date   CHOLHDL 2.1 08/04/2021   No results found for: HGBA1C      Results for orders placed or performed in visit on 01/23/24  Influenza A/B   Collection Time: 01/23/24  1:48 PM  Result Value Ref Range   Influenza A, POC Negative Negative   Influenza B, POC Negative Negative  POC COVID-19   Collection Time: 01/23/24  1:48 PM  Result Value Ref Range   SARS Coronavirus 2 Ag Negative Negative  POCT rapid strep A   Collection Time: 01/23/24  1:48 PM  Result Value Ref Range   Rapid Strep A Screen Negative Negative     Assessment & Plan:   Assessment & Plan     There is no height or weight on file to calculate BMI.SABRA  Assessment and Plan Assessment & Plan      No orders of the defined types were placed in this encounter.   No orders of the defined types were placed in this encounter.    Follow-up: No follow-ups on file.  An After Visit Summary was printed and given to the patient.  Abigail Free, MD [facility] [phone]

## 2024-10-07 ENCOUNTER — Other Ambulatory Visit: Payer: Self-pay | Admitting: Family Medicine

## 2024-10-07 ENCOUNTER — Ambulatory Visit: Admitting: Family Medicine

## 2024-10-07 ENCOUNTER — Telehealth: Payer: Self-pay

## 2024-10-07 ENCOUNTER — Encounter: Payer: Self-pay | Admitting: Family Medicine

## 2024-10-07 VITALS — BP 134/98 | HR 94 | Temp 97.0°F | Ht 63.0 in | Wt 153.0 lb

## 2024-10-07 DIAGNOSIS — J208 Acute bronchitis due to other specified organisms: Secondary | ICD-10-CM | POA: Diagnosis not present

## 2024-10-07 DIAGNOSIS — J00 Acute nasopharyngitis [common cold]: Secondary | ICD-10-CM | POA: Diagnosis not present

## 2024-10-07 MED ORDER — HYDROCOD POLI-CHLORPHE POLI ER 10-8 MG/5ML PO SUER: 5.0000 mL | Freq: Two times a day (BID) | ORAL | 0 refills | Status: DC | PRN

## 2024-10-07 MED ORDER — AMOXICILLIN-POT CLAVULANATE 875-125 MG PO TABS: 1.0000 | ORAL_TABLET | Freq: Two times a day (BID) | ORAL | 0 refills | Status: AC

## 2024-10-07 NOTE — Assessment & Plan Note (Signed)
 Acute cough with postnasal drainage and chronic rhinitis Persistent cough with postnasal drainage and green sputum, likely viral. No fever or signs of strep throat. Elevated blood pressure possibly due to phenylephrine. - Prescribed Tussionex for cough. - Prescribed Z-Pak. - Rechecked blood pressure after uncrossing legs.

## 2024-10-07 NOTE — Telephone Encounter (Signed)
 Copied from CRM #[id number]. Topic: Clinical - Prescription Issue >> Oct 07, 2024  2:44 PM Fonda T wrote: Reason for CRM: Pt calling to reports she went to pick up medication prescribed today, per preferred pharmacy did not have cough medication available in stock.  Pt is requesting cough medication, chlorpheniramine-HYDROcodone  (TUSSIONEX) 10-8 MG/5ML, be sent to another pharmacy.  Pt is requesting a follow up call once this has been done, at [phone].  Send to: [id number] [place], [place] - [address] AT [facility] [address] [place] [address] Phone: [phone] Fax: [phone]

## 2024-10-07 NOTE — Telephone Encounter (Signed)
 Patient wants us  to try to send cough medication (TUSSIONEX) to [facility] in [place] on [address] due to her pharmacy not having the medication in stock.

## 2024-10-07 NOTE — Assessment & Plan Note (Signed)
 Acute cough with postnasal drainage and chronic rhinitis Persistent cough with postnasal drainage and green sputum, likely viral. No fever or signs of strep throat. Elevated blood pressure possibly due to phenylephrine. - Prescribed Tussionex for cough. - Prescribed Z-Pak. - Rechecked blood pressure still high. Patient to come by next week to recheck bp. Stop otc phenylephrine.

## 2024-10-07 NOTE — Patient Instructions (Signed)
  VISIT SUMMARY: Today, you were seen for a persistent cough that has been bothering you, especially with postnasal drainage and green sputum. You also mentioned some voice changes and concerns about spreading illness during Thanksgiving.  YOUR PLAN: PERSISTENT COUGH WITH POSTNASAL DRAINAGE AND CHRONIC RHINITIS: You have a persistent cough with postnasal drainage and green sputum, likely due to a viral infection. There are no signs of strep throat or fever. -You have been prescribed Tussionex for your cough. -You have been prescribed a Augmentin  to address any potential bacterial infection. -We rechecked your blood pressure after you uncrossed your legs, as it was elevated possibly due to phenylephrine.                      Contains text generated by [employer].                                 Contains text generated by [employer].

## 2024-10-07 NOTE — Telephone Encounter (Signed)
 Per dr. Sherre she is sending it to [facility] on [address].

## 2024-10-08 ENCOUNTER — Other Ambulatory Visit: Payer: Self-pay

## 2024-10-08 ENCOUNTER — Other Ambulatory Visit: Payer: Self-pay | Admitting: Family Medicine

## 2024-10-08 MED ORDER — PROMETHAZINE-DM 6.25-15 MG/5ML PO SYRP: 5.0000 mL | ORAL_SOLUTION | Freq: Four times a day (QID) | ORAL | 0 refills | Status: AC | PRN

## 2024-10-08 NOTE — Telephone Encounter (Signed)
 Dr Sherre, Please advise....  Copied from CRM #[id number]. Topic: Clinical - Prescription Issue >> Oct 08, 2024  8:21 AM Jane Duncan wrote: Reason for CRM: The patient has called to share that their prescription for chlorpheniramine-HYDROcodone  (TUSSIONEX) 10-8 MG/5ML [[id number]] has worked but caused skin irritation and disrupted their sleep. The patient has called to request an alternative prescription to help with their cough, that was previously discussed with their PCP. The patient has requested to be notified when a new prescription is submitted for them

## 2024-10-08 NOTE — Telephone Encounter (Signed)
 Patient was informed. She will pickup the new medicine. She does not have any hives. She was only itching and she could not sleep. She said thank you.
